# Patient Record
Sex: Female | Born: 1995
Health system: Southern US, Community
[De-identification: ages and names within clinical notes are randomized; demographics above are authoritative.]

## PROBLEM LIST (undated history)

## (undated) DIAGNOSIS — E039 Hypothyroidism, unspecified: Secondary | ICD-10-CM

## (undated) HISTORY — PX: OTHER SURGICAL HISTORY: SHX169

---
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
## 2014-12-14 HISTORY — PX: BREAST REDUCTION SURGERY: SHX8

## 2016-03-12 DIAGNOSIS — R1030 Lower abdominal pain, unspecified: Secondary | ICD-10-CM | POA: Diagnosis not present

## 2016-03-12 DIAGNOSIS — R000 Tachycardia, unspecified: Secondary | ICD-10-CM | POA: Diagnosis not present

## 2016-04-16 DIAGNOSIS — M542 Cervicalgia: Secondary | ICD-10-CM | POA: Diagnosis not present

## 2016-04-16 DIAGNOSIS — N62 Hypertrophy of breast: Secondary | ICD-10-CM | POA: Diagnosis not present

## 2016-04-16 DIAGNOSIS — M25512 Pain in left shoulder: Secondary | ICD-10-CM | POA: Diagnosis not present

## 2016-04-16 DIAGNOSIS — M25511 Pain in right shoulder: Secondary | ICD-10-CM | POA: Diagnosis not present

## 2016-04-16 DIAGNOSIS — M549 Dorsalgia, unspecified: Secondary | ICD-10-CM | POA: Diagnosis not present

## 2016-10-27 DIAGNOSIS — E669 Obesity, unspecified: Secondary | ICD-10-CM | POA: Diagnosis not present

## 2016-10-27 DIAGNOSIS — Z6837 Body mass index (BMI) 37.0-37.9, adult: Secondary | ICD-10-CM | POA: Diagnosis not present

## 2016-10-27 DIAGNOSIS — R635 Abnormal weight gain: Secondary | ICD-10-CM | POA: Diagnosis not present

## 2016-10-27 DIAGNOSIS — R300 Dysuria: Secondary | ICD-10-CM | POA: Diagnosis not present

## 2016-10-27 DIAGNOSIS — N926 Irregular menstruation, unspecified: Secondary | ICD-10-CM | POA: Diagnosis not present

## 2016-12-19 ENCOUNTER — Telehealth: Payer: No Typology Code available for payment source | Admitting: Nurse Practitioner

## 2016-12-19 DIAGNOSIS — J0101 Acute recurrent maxillary sinusitis: Secondary | ICD-10-CM

## 2016-12-19 MED ORDER — AZITHROMYCIN 250 MG PO TABS: ORAL_TABLET | ORAL | 0 refills | Status: DC

## 2016-12-19 NOTE — Progress Notes (Signed)

## 2017-01-22 DIAGNOSIS — Z9889 Other specified postprocedural states: Secondary | ICD-10-CM | POA: Diagnosis not present

## 2017-05-07 DIAGNOSIS — J301 Allergic rhinitis due to pollen: Secondary | ICD-10-CM | POA: Diagnosis not present

## 2017-05-07 DIAGNOSIS — J0101 Acute recurrent maxillary sinusitis: Secondary | ICD-10-CM | POA: Diagnosis not present

## 2017-07-16 ENCOUNTER — Emergency Department (HOSPITAL_COMMUNITY)
Admission: EM | Admit: 2017-07-16 | Discharge: 2017-07-16 | Disposition: A | Discharge status: Home / Self Care | Payer: PRIVATE HEALTH INSURANCE | Attending: Emergency Medicine | Admitting: Emergency Medicine

## 2017-07-16 ENCOUNTER — Encounter (HOSPITAL_COMMUNITY): Payer: Self-pay | Admitting: Obstetrics and Gynecology

## 2017-07-16 ENCOUNTER — Emergency Department (HOSPITAL_COMMUNITY): Payer: PRIVATE HEALTH INSURANCE

## 2017-07-16 DIAGNOSIS — S83002A Unspecified subluxation of left patella, initial encounter: Secondary | ICD-10-CM

## 2017-07-16 DIAGNOSIS — Y929 Unspecified place or not applicable: Secondary | ICD-10-CM | POA: Diagnosis not present

## 2017-07-16 DIAGNOSIS — S8992XA Unspecified injury of left lower leg, initial encounter: Secondary | ICD-10-CM | POA: Diagnosis present

## 2017-07-16 DIAGNOSIS — Y9301 Activity, walking, marching and hiking: Secondary | ICD-10-CM | POA: Diagnosis not present

## 2017-07-16 DIAGNOSIS — Y999 Unspecified external cause status: Secondary | ICD-10-CM | POA: Diagnosis not present

## 2017-07-16 DIAGNOSIS — X509XXA Other and unspecified overexertion or strenuous movements or postures, initial encounter: Secondary | ICD-10-CM | POA: Diagnosis not present

## 2017-07-16 MED ORDER — IBUPROFEN 200 MG PO TABS
600.0000 mg | ORAL_TABLET | Freq: Once | ORAL | Status: AC
  Administered 2017-07-16: 600 mg via ORAL
  Filled 2017-07-16: qty 3

## 2017-07-16 MED ORDER — ACETAMINOPHEN 500 MG PO TABS
1000.0000 mg | ORAL_TABLET | Freq: Once | ORAL | Status: AC
  Administered 2017-07-16: 1000 mg via ORAL
  Filled 2017-07-16: qty 2

## 2017-07-16 MED ORDER — OXYCODONE-ACETAMINOPHEN 5-325 MG PO TABS: 2.0000 | ORAL_TABLET | Freq: Four times a day (QID) | ORAL | 0 refills | Status: AC | PRN

## 2017-07-16 NOTE — ED Notes (Signed)
Patient transported to X-ray 

## 2017-07-16 NOTE — ED Provider Notes (Signed)
[facility] Provider Note   CSN: [id number] Arrival date & time: 07/16/17  1214     History   Chief Complaint Chief Complaint  Patient presents with  . Knee Injury    HPI Jaclyn Reynolds is a 21 y.o. female with h/o R patella fracture s/p ORIF presents to ED for evaluation of acute onset left knee pain that started today. Pt was at work walking when she felt her left knee pop out of place laterally, which caused her to fall landing directly on her left knee. She has been able to ambulate with some pain, "feels tight". Associated symptoms include swelling. Aggravating factors include palpation, flexion and weight baring. Alleviating factors include icing and rest.    No head trauma or LOC. No numbness or weakness distally.  HPI  No past medical history on file.  Patient Active Problem List   Diagnosis Date Noted  . Painful orthopaedic hardware (HCC) 04/21/2013  . Right patella fracture 09/02/2012    Past Surgical History:  Procedure Laterality Date  . FRACTURE SURGERY Right 09-02-12   current  . HARDWARE REMOVAL Right 04/21/2013   Procedure: HARDWARE REMOVAL OF RIGHT KNEE;  Surgeon: Loanne DrillingFrank V Aluisio, MD;  Location: [facility];  Service: Orthopedics;  Laterality: Right;  . no previous surgery     had caps put on teeth age 89 in dentist office -probable sedation?  . ORIF PATELLA  09/02/2012   Procedure: OPEN REDUCTION INTERNAL (ORIF) FIXATION PATELLA;  Surgeon: Loanne DrillingFrank V Aluisio, MD;  Location: [facility];  Service: Orthopedics;  Laterality: Right;    OB History    No data available       Home Medications    Prior to Admission medications   Medication Sig Start Date End Date Taking? Authorizing Provider  azithromycin (ZITHROMAX Z-PAK) 250 MG tablet As directed 12/19/16   Bennie PieriniMartin, Mary-Margaret, FNP  HYDROcodone-acetaminophen (NORCO) 5-325 MG per tablet Take 1 tablet by mouth every 6 (six) hours as needed for pain. 04/21/13   Ollen GrossAluisio, Frank, MD  oxyCODONE-acetaminophen  (PERCOCET/ROXICET) 5-325 MG tablet Take 2 tablets by mouth every 6 (six) hours as needed for severe pain. 07/16/17 07/19/17  Liberty HandyGibbons, Quinnie Barcelo J, PA-C    Family History Family History  Problem Relation Age of Onset  . Hypertension Mother   . Hypertension Maternal Grandmother   . Hyperlipidemia Maternal Grandmother   . Hypertension Maternal Grandfather   . Hyperlipidemia Maternal Grandfather     Social History Social History  Substance Use Topics  . Smoking status: Never Smoker  . Smokeless tobacco: Never Used  . Alcohol use No     Allergies   Patient has no known allergies.   Review of Systems Review of Systems  Musculoskeletal: Positive for arthralgias, gait problem and joint swelling.  Skin: Positive for wound.     Physical Exam Updated Vital Signs BP 139/85   Pulse 70   Temp 98.2 F (36.8 C) (Oral)   Resp 16   Ht 5\' 3"  (1.6 m)   Wt 99.8 kg (220 lb)   LMP 06/13/2017 (Approximate)   SpO2 97%   BMI 38.97 kg/m   Physical Exam  Constitutional: She is oriented to person, place, and time. She appears well-developed and well-nourished. No distress.  NAD.  HENT:  Head: Normocephalic and atraumatic.  Right Ear: External ear normal.  Left Ear: External ear normal.  Nose: Nose normal.  Eyes: Conjunctivae and EOM are normal. No scleral icterus.  Neck: Normal range of motion. Neck supple.  Cardiovascular: Normal rate, regular rhythm and normal heart sounds.   No murmur heard. Pulses:      Popliteal pulses are 2+ on the right side, and 2+ on the left side.       Dorsalis pedis pulses are 2+ on the right side, and 2+ on the left side.  Pulmonary/Chest: Effort normal and breath sounds normal. She has no wheezes.  Musculoskeletal: Normal range of motion. She exhibits edema and tenderness. She exhibits no deformity.  Mild infra patellar edema with tiny abrasion Mild lateral joint line and LCL tenderness.   Full passive ROM of knees bilaterally with normal patellar J  tracking bilaterally.  No bony tenderness over patella, fibular head or tibial tuberosity.   No tenderness over MCL, patellar tendon or quadriceps tendon.    Negative Lachman's. Negative posterior drawer test.  Negative McMurray's. Negative ballottement test. No varus or valgus laxity.  No crepitus with knee ROM.  Patient able to bear weight in ED (4+ steps)  Neurological: She is alert and oriented to person, place, and time.  Sensation to light touch intact in lower extremities 5/5 strength with left knee and ankle flexion and extension  Skin: Skin is warm and dry. Capillary refill takes less than 2 seconds.  Psychiatric: She has a normal mood and affect. Her behavior is normal. Judgment and thought content normal.  Nursing note and vitals reviewed.    ED Treatments / Results  Labs (all labs ordered are listed, but only abnormal results are displayed) Labs Reviewed - No data to display  EKG  EKG Interpretation None       Radiology Dg Knee Complete 4 Views Left  Result Date: 07/16/2017 CLINICAL DATA:  21 y/o F; fall with possible subluxation of the patella. EXAM: LEFT KNEE - COMPLETE 4+ VIEW COMPARISON:  None. FINDINGS: No evidence of fracture, dislocation, or joint effusion. On the frontal view the patella is mild displaced laterally from the midline. IMPRESSION: 1. On the frontal view the patella is subluxed laterally from the midline. 2. No fracture, dislocation, or joint effusion identified. Electronically Signed   By: Mitzi Hansen M.D.   On: 07/16/2017 13:50    Procedures Procedures (including critical care time)  Medications Ordered in ED Medications  ibuprofen (ADVIL,MOTRIN) tablet 600 mg (600 mg Oral Given 07/16/17 1302)  acetaminophen (TYLENOL) tablet 1,000 mg (1,000 mg Oral Given 07/16/17 1302)     Initial Impression / Assessment and Plan / ED Course  I have reviewed the triage vital signs and the nursing notes.  Pertinent labs & imaging results that  were available during my care of the patient were reviewed by me and considered in my medical decision making (see chart for details).  Clinical Course as of Jul 16 1509  Fri Jul 16, 2017  1407 FINDINGS: No evidence of fracture, dislocation, or joint effusion. On the frontal view the patella is mild displaced laterally from the midline.  IMPRESSION: 1. On the frontal view the patella is subluxed laterally from the midline. 2. No fracture, dislocation, or joint effusion identified DG Knee Complete 4 Views Left [CG]    Clinical Course User Index [CG] Liberty Handy, PA-C   21 year old female with history of right patellar fracture s/p ORIF by Dr. Despina Hick presents to the ED for sudden onset left knee pain. Based on clinical exam, patella is in correct position. She has full passive range of motion of the left knee with minimal pain and adequate J tracking, mild  lateral joint  line tenderness and edema. Some laxity noted. She is ambulatory. X-ray shows subluxation most likely, no dislocation. Extremities neurovascularly intact distally. Will place in a knee immobilizer, patient has her own crutches at home. Will instruct to follow up with orthopedist within 1 week for reevaluation, likely PT, clearance to return to work. ED return precautions given. Patient verbalized understanding and is agreeable. Work note given.  Patient, ED treatment and discharge plan was discussed with supervising physician who also evaluated the patient and is agreeable with plan.   Final Clinical Impressions(s) / ED Diagnoses   Final diagnoses:  Subluxation of left patella, initial encounter    New Prescriptions New Prescriptions   OXYCODONE-ACETAMINOPHEN (PERCOCET/ROXICET) 5-325 MG TABLET    Take 2 tablets by mouth every 6 (six) hours as needed for severe pain.     Liberty HandyGibbons, Luvern Mcisaac J, PA-C 07/16/17 1510    Charlynne PanderYao, David Hsienta, MD 07/16/17 [phone]

## 2017-07-16 NOTE — ED Notes (Signed)
Bed: WTR7 Expected date:  Expected time:  Means of arrival:  Comments: Knee injury(employee from [employer])

## 2017-07-16 NOTE — ED Notes (Signed)
Ortho Tech called. 

## 2017-07-16 NOTE — ED Triage Notes (Signed)
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 Pt felt her knee turn and felt it go out of place. C/o knee pain. Pain level 4/10

## 2017-07-16 NOTE — Discharge Instructions (Signed)
You were evaluated in the ED for left knee pain. Exam and x-ray are consistent with subluxation but no dislocation. Please wear your knee immobilizer until reevaluated and cleared by orthopedist. I recommend she schedule an appointment within one week so they can clear you to return to work. For pain take 400 mg of ibuprofen +650 mg of Tylenol every 8 hours. You have a prescription for Percocet that he can take every 6 hours for breakthrough or severe pain only. Ice, elevate and rest to decrease inflammation.  Percocet is a narcotic pain medication that has risk of overdose, death, dependence and abuse. Do not consume alcohol, drive or use heavy machinery while taking this medication. Do not leave unattended around children. The emergency department has a strict policy regarding prescription of narcotic medications. We are unable to refill this medication in the emergency department for chronic pain. Contact your primary care provider or specialist for chronic pain management and refill on narcotic medications.

## 2017-08-12 DIAGNOSIS — J301 Allergic rhinitis due to pollen: Secondary | ICD-10-CM | POA: Diagnosis not present

## 2017-09-28 DIAGNOSIS — H16012 Central corneal ulcer, left eye: Secondary | ICD-10-CM | POA: Diagnosis not present

## 2017-09-30 DIAGNOSIS — H16002 Unspecified corneal ulcer, left eye: Secondary | ICD-10-CM | POA: Diagnosis not present

## 2019-02-07 MED FILL — OSELTAMIVIR PHOSPHATE 75 MG: 75 | 10 days supply | Qty: 10 | Fill #0

## 2019-09-21 ENCOUNTER — Other Ambulatory Visit: Payer: Self-pay

## 2019-09-21 ENCOUNTER — Encounter: Payer: Self-pay | Admitting: Allergy & Immunology

## 2019-09-21 ENCOUNTER — Ambulatory Visit (INDEPENDENT_AMBULATORY_CARE_PROVIDER_SITE_OTHER): Payer: BC Managed Care – PPO | Admitting: Allergy & Immunology

## 2019-09-21 VITALS — BP 118/78 | HR 97 | Temp 98.7°F | Resp 18 | Ht 63.0 in | Wt 231.0 lb

## 2019-09-21 DIAGNOSIS — J3089 Other allergic rhinitis: Secondary | ICD-10-CM

## 2019-09-21 MED ORDER — MONTELUKAST SODIUM 10 MG PO TABS: 10.0000 mg | ORAL_TABLET | Freq: Every day | ORAL | 5 refills | Status: DC

## 2019-09-21 MED ORDER — FLUTICASONE PROPIONATE 50 MCG/ACT NA SUSP: 1.0000 | Freq: Every day | NASAL | 5 refills | Status: DC

## 2019-09-21 NOTE — Progress Notes (Signed)
NEW PATIENT  Date of Service/Encounter:  09/21/19  Referring provider: Leeroy Cha, MD   Assessment:   Perennial allergic rhinitis (dust mites)   Jaclyn Reynolds has an otherwise unremarkable past medical history, including a lack of atopic disease, but presents today for an evaluation of chronic rhinitis.  Symptoms acutely started around 2 years ago. They last year round and they have been very responsive to montelukast and cetirizine.  We did provide dust mite avoidance measures today.  I think with a combination of medications and avoidance measures, her symptoms will improve.  We did discuss appropriate use of the nasal steroid, including the technique.  Could consider allergen immunotherapy in the future if needed, but we will touch base in 2 months to see how she is doing.  If we do go ahead with allergen immunotherapy, we might need to do the more sensitive intradermal testing for her as well.   Plan/Recommendations:   1. Perennial allergic rhinitis - Testing today showed: dust mites - Copy of test results provided.  - Avoidance measures provided. - Continue with: Allegra (fexofenadine) 180mg  table once daily, Singulair (montelukast) 10mg  daily and Flonase (fluticasone) one spray per nostril daily - You can use an extra dose of the antihistamine, if needed, for breakthrough symptoms.  - Consider nasal saline rinses 1-2 times daily to remove allergens from the nasal cavities as well as help with mucous clearance (this is especially helpful to do before the nasal sprays are given)  2. Return in about 2 months (around 11/21/2019). This can be an in-person, a virtual Webex or a telephone follow up visit.   Subjective:   Jaclyn Reynolds is a 23 y.o. female presenting today for evaluation of  Chief Complaint  Patient presents with  . Allergic Rhinitis     Jaclyn Reynolds has a history of the following: Patient Active Problem List   Diagnosis Date Noted  . Painful orthopaedic  hardware ([facility]) 04/21/2013  . Right patella fracture 09/02/2012    History obtained from: chart review and patient.  Jaclyn Reynolds was referred by Leeroy Cha, MD.     Jaclyn Reynolds is a 23 y.o. female presenting for an evaluation of allergic rhinitis. 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    Allergic Rhinitis Symptom History: She has had symptoms for two years since she got married. When this first started, she was living in some old apartments. They did get a dog for around one year. But her symptoms started before this. She did not have any of these problems before she got married. Symptoms do not improve during the winter at all. iut tends to get worse when the seasons change. Her symptoms are all associated with sneezing and rhinorrhea. She was having problems waking up in the middle of the night with congestion. She has tried Xyzal, Human resources officer, Zyrtec, and Singulair. Singulair was just started around one week. With the Singulair and Allegra, she could breathe again. She used fluticasone but she was having nasal dryness. Her symptoms do get worse inside of the hospital versus outside of the hospital. Symptoms do seem better outside versus inside but they are always present.   She gets sinus infections around one or two times per year. Otherwise she does not get any antibiotics or infections at all.   Otherwise, there is no history of other atopic diseases, including asthma, food allergies, drug allergies, stinging insect allergies, eczema,  urticaria or contact dermatitis. There is no significant infectious history. Vaccinations are up to date.    Past Medical History: Patient Active Problem List   Diagnosis Date Noted  . Painful orthopaedic hardware ([facility]) 04/21/2013  . Right patella fracture 09/02/2012    Medication List:  Allergies as of 09/21/2019      Reactions   Doxycycline Rash      Medication List        Accurate as of September 21, 2019 12:00 PM. If you have any questions, ask your nurse or doctor.        STOP taking these medications   azithromycin 250 MG tablet Commonly known as: Zithromax Z-Pak Stopped by: Alfonse Spruce, MD   HYDROcodone-acetaminophen 5-325 MG tablet Commonly known as: Norco Stopped by: Alfonse Spruce, MD     TAKE these medications   Blisovi 24 Fe 1-20 MG-MCG(24) tablet Generic drug: Norethindrone Acetate-Ethinyl Estrad-FE   cetirizine 10 MG tablet Commonly known as: ZYRTEC Take 10 mg by mouth daily.   fexofenadine 180 MG tablet Commonly known as: ALLEGRA Take 180 mg by mouth daily.   fluticasone 50 MCG/ACT nasal spray Commonly known as: FLONASE Place 1 spray into both nostrils daily. Started by: Alfonse Spruce, MD   mometasone 50 MCG/ACT nasal spray Commonly known as: NASONEX PLACE 2 SPRAYS IN EACH NOSTRIL ONCE A DAY   montelukast 10 MG tablet Commonly known as: SINGULAIR Take 1 tablet (10 mg total) by mouth daily.   Xyzal 5 MG tablet Generic drug: levocetirizine Take 5 mg by mouth every evening.       Birth History: non-contributory  Developmental History: non-contributory  Past Surgical History: Past Surgical History:  Procedure Laterality Date  . BREAST REDUCTION SURGERY  2016  . FRACTURE SURGERY Right 09-02-12   current  . HARDWARE REMOVAL Right 04/21/2013   Procedure: HARDWARE REMOVAL OF RIGHT KNEE;  Surgeon: Loanne Drilling, MD;  Location: [facility];  Service: Orthopedics;  Laterality: Right;  . no previous surgery     had caps put on teeth age 86 in dentist office -probable sedation?  . ORIF PATELLA  09/02/2012   Procedure: OPEN REDUCTION INTERNAL (ORIF) FIXATION PATELLA;  Surgeon: Loanne Drilling, MD;  Location: [facility];  Service: Orthopedics;  Laterality: Right;     Family History: Family History  Problem Relation Age of Onset  . Hypertension Mother   . Hypertension Maternal Grandmother   . Hyperlipidemia  Maternal Grandmother   . Hypertension Maternal Grandfather   . Hyperlipidemia Maternal Grandfather      Social History: Jaclyn Reynolds lives at home with her husband of two years. They do have a dog for the past year.  They live in an apartment that is 24 years old.  There is carpeting throughout the apartment.  They have electric heating and central cooling.  There are no dust mite covers on the bedding.  There is no tobacco exposure.  Her husband works for [employer] with billing.  He previously worked as a Personnel officer.   Review of Systems  Constitutional: Negative.  Negative for fever, malaise/fatigue and weight loss.  HENT: Positive for congestion. Negative for ear discharge, ear pain and sinus pain.        Positive for postnasal drip.  Eyes: Negative for pain, discharge and redness.  Respiratory: Negative for cough, sputum production, shortness of breath and wheezing.   Cardiovascular: Negative.  Negative for chest pain and palpitations.  Gastrointestinal: Negative for abdominal pain, constipation, diarrhea,  heartburn, nausea and vomiting.  Skin: Negative.  Negative for itching and rash.  Neurological: Negative for dizziness and headaches.  Endo/Heme/Allergies: Positive for environmental allergies. Does not bruise/bleed easily.       Objective:   Blood pressure 118/78, pulse 97, temperature 98.7 F (37.1 C), temperature source Temporal, resp. rate 18, height 5\' 3"  (1.6 m), weight 231 lb (104.8 kg), SpO2 97 %. Body mass index is 40.92 kg/m.   Physical Exam:   Physical Exam  Constitutional: She appears well-developed.  Obese female.  Very friendly and interactive.  HENT:  Head: Normocephalic and atraumatic.  Right Ear: Tympanic membrane, external ear and ear canal normal. No drainage, swelling or tenderness. Tympanic membrane is not injected, not scarred, not erythematous, not retracted and not bulging.  Left Ear: Tympanic membrane, external ear and ear canal normal. No  drainage, swelling or tenderness. Tympanic membrane is not injected, not scarred, not erythematous, not retracted and not bulging.  Nose: Mucosal edema and rhinorrhea present. No nasal deformity or septal deviation. No epistaxis. Right sinus exhibits no maxillary sinus tenderness and no frontal sinus tenderness. Left sinus exhibits no maxillary sinus tenderness and no frontal sinus tenderness.  Mouth/Throat: Uvula is midline and oropharynx is clear and moist. Mucous membranes are not pale and not dry.  Tonsils 2+ without exudates.  Eyes: Pupils are equal, round, and reactive to light. Conjunctivae and EOM are normal. Right eye exhibits no chemosis and no discharge. Left eye exhibits no chemosis and no discharge. Right conjunctiva is not injected. Left conjunctiva is not injected.  Cardiovascular: Normal rate, regular rhythm and normal heart sounds.  Respiratory: Effort normal and breath sounds normal. No accessory muscle usage. No tachypnea. No respiratory distress. She has no wheezes. She has no rhonchi. She has no rales. She exhibits no tenderness.  Moving air well in all lung fields.  GI: There is no abdominal tenderness. There is no rebound and no guarding.  Lymphadenopathy:       Head (right side): No submandibular, no tonsillar and no occipital adenopathy present.       Head (left side): No submandibular, no tonsillar and no occipital adenopathy present.    She has no cervical adenopathy.  Neurological: She is alert.  Skin: No abrasion, no petechiae and no rash noted. Rash is not papular, not vesicular and not urticarial. No erythema. No pallor.  No eczematous or urticarial lesions noted.  Psychiatric: She has a normal mood and affect.     Diagnostic studies:    Spirometry:   Allergy Studies:    Airborne Adult Perc - 09/21/19 1009    Time Antigen Placed  1015    Allergen Manufacturer  11/21/19    Location  Back    Number of Test  59    1. Control-Buffer 50% Glycerol  Negative    2.  Control-Histamine 1 mg/ml  Negative    3. Albumin saline  Negative    4. Bahia  Negative    5. Waynette Buttery  Negative    6. Johnson  Negative    7. Kentucky Blue  Negative    8. Meadow Fescue  Negative    9. Perennial Rye  Negative    10. Sweet Vernal  Negative    11. Timothy  Negative    12. Cocklebur  Negative    13. Burweed Marshelder  Negative    14. Ragweed, short  Negative    15. Ragweed, Giant  Negative    16. Plantain,  English  Negative    17. Lamb's Quarters  Negative    18. Sheep Sorrell  Negative    19. Rough Pigweed  Negative    20. Marsh Elder, Rough  Negative    21. Mugwort, Common  Negative    22. Ash mix  Negative    23. Birch mix  Negative    24. Beech American  Negative    25. Box, Elder  Negative    26. Cedar, red  Negative    27. Cottonwood, Guinea-BissauEastern  Negative    28. Elm mix  Negative    29. Hickory mix  Negative    30. Maple mix  Negative    31. Oak, Guinea-BissauEastern mix  Negative    32. Pecan Pollen  Negative    33. Pine mix  Negative    34. Sycamore Eastern  Negative    35. Walnut, Black Pollen  Negative    36. Alternaria alternata  Negative    37. Cladosporium Herbarum  Negative    38. Aspergillus mix  Negative    39. Penicillium mix  Negative    40. Bipolaris sorokiniana (Helminthosporium)  Negative    41. Drechslera spicifera (Curvularia)  Negative    42. Mucor plumbeus  Negative    43. Fusarium moniliforme  Negative    44. Aureobasidium pullulans (pullulara)  Negative    45. Rhizopus oryzae  Negative    46. Botrytis cinera  Negative    47. Epicoccum nigrum  Negative    48. Phoma betae  Negative    49. Candida Albicans  Negative    50. Trichophyton mentagrophytes  Negative    51. Mite, D Farinae  5,000 AU/ml  3+    52. Mite, D Pteronyssinus  5,000 AU/ml  4+    53. Cat Hair 10,000 BAU/ml  Negative    54.  Dog Epithelia  Negative    55. Mixed Feathers  Negative    56. Horse Epithelia  Negative    57. Cockroach, German  Negative    58. Mouse  Negative     59. Tobacco Leaf  Negative       Allergy testing results were read and interpreted by myself, documented by clinical staff.         Malachi BondsJoel Illiana Losurdo, MD [facility]

## 2019-09-21 NOTE — Patient Instructions (Addendum)
1. Perennial allergic rhinitis - Testing today showed: dust mites - Copy of test results provided.  - Avoidance measures provided. - Continue with: Allegra (fexofenadine) 180mg  table once daily, Singulair (montelukast) 10mg  daily and Flonase (fluticasone) one spray per nostril daily - You can use an extra dose of the antihistamine, if needed, for breakthrough symptoms.  - Consider nasal saline rinses 1-2 times daily to remove allergens from the nasal cavities as well as help with mucous clearance (this is especially helpful to do before the nasal sprays are given)  2. Return in about 2 months (around 11/21/2019). This can be an in-person, a virtual Webex or a telephone follow up visit.   Please inform of any Emergency Department visits, hospitalizations, or changes in symptoms. Call 14/07/2019 before going to the ED for breathing or allergy symptoms since we might be able to fit you in for a sick visit. Feel free to contact us anytime with any questions, problems, or concerns.  It was a pleasure to meet you today!  Websites that have reliable patient information: 1. American Academy of Asthma, Allergy, and Immunology: www.aaaai.org 2. Food Allergy Research and Education (FARE): foodallergy.org 3. Mothers of Asthmatics: http://www.asthmacommunitynetwork.org 4. American College of Allergy, Asthma, and Immunology: www.acaai.org  "Like" [place] on Facebook and Instagram for our latest updates!      Make sure you are registered to vote! If you have moved or changed any of your contact information, you will need to get this updated before voting!  In some cases, you MAY be able to register to vote online: [place]    Voter ID laws are NOT going into effect for the General Election in November 2020! DO NOT let this stop you from exercising your right to vote!   Absentee voting is the SAFEST way to vote during the coronavirus pandemic!   Download and print an  absentee ballot request form at [email or web address]-[email or web address]-Request or you can scan the QR code below with your smart phone:      More information on absentee ballots can be found here: [email or web address]://[email or web address]-Absentee    Control of House Dust Mite Allergen    House dust mites play a major role in allergic asthma and rhinitis.  They occur in environments with high humidity wherever human skin, the food for dust mites is found. High levels have been detected in dust obtained from mattresses, pillows, carpets, upholstered furniture, bed covers, clothes and soft toys.  The principal allergen of the house dust mite is found in its feces.  A gram of dust may contain 1,000 mites and 250,000 fecal particles.  Mite antigen is easily measured in the air during house cleaning activities.    1. Encase mattresses, including the box spring, and pillow, in an air tight cover.  Seal the zipper end of the encased mattresses with wide adhesive tape. 2. Wash the bedding in water of 130 degrees Farenheit weekly.  Avoid cotton comforters/quilts and flannel bedding: the most ideal bed covering is the dacron comforter. 3. Remove all upholstered furniture from the bedroom. 4. Remove carpets, carpet padding, rugs, and non-washable window drapes from the bedroom.  Wash drapes weekly or use plastic window coverings. 5. Remove all non-washable stuffed toys from the bedroom.  Wash stuffed toys weekly. 6. Have the room cleaned frequently with a vacuum cleaner and a damp dust-mop.  The patient should not be in a room which is being cleaned and should wait 1 hour after cleaning before going into the room. 7. Close and  seal all heating outlets in the bedroom.  Otherwise, the room will become filled with dust-laden air.  An electric heater can be used to heat the room. 8. Reduce indoor humidity to less than 50%.  Do not use a humidifier.

## 2019-11-21 ENCOUNTER — Ambulatory Visit (INDEPENDENT_AMBULATORY_CARE_PROVIDER_SITE_OTHER): Payer: BC Managed Care – PPO | Admitting: Allergy & Immunology

## 2019-11-21 ENCOUNTER — Encounter: Payer: Self-pay | Admitting: Allergy & Immunology

## 2019-11-21 DIAGNOSIS — J3089 Other allergic rhinitis: Secondary | ICD-10-CM | POA: Diagnosis not present

## 2019-11-21 MED ORDER — MONTELUKAST SODIUM 10 MG PO TABS: 10.0000 mg | ORAL_TABLET | Freq: Every day | ORAL | 5 refills | Status: DC

## 2019-11-21 NOTE — Progress Notes (Signed)
RE: Jaclyn Reynolds MRN: [id number] DOB: 06-27-1996 Date of Telemedicine Visit: 11/21/2019  Referring provider: Lorenda Ishihara,* Primary care provider: Lorenda Ishihara, MD  Chief Complaint: Allergic Rhinitis    Telemedicine Follow Up Visit via Telephone: I connected with Jaclyn Reynolds for a follow up on 11/21/19 by telephone and verified that I am speaking with the correct person using two identifiers.   I discussed the limitations, risks, security and privacy concerns of performing an evaluation and management service by telephone and the availability of in person appointments. I also discussed with the patient that there may be a patient responsible charge related to this service. The patient expressed understanding and agreed to proceed.  Patient is at home.  Provider is at the office.  Visit start time: 11:28 AM Visit end time: 11:41 AM Insurance consent/check in by: Dr. Dellis Anes Medical consent and medical assistant/nurse: Dr. Dellis Anes  History of Present Illness:  She is a 23 y.o. female, who is being followed for perennial allergic rhinitis. Her previous allergy office visit was in October 2020 with myself. At that time, she had prick testing that was positive only to dust mites (although we did not do intradermal testing). We continued her on montelukast, fexofenadine, and fluticasone. We also recommended avoidance measures to help decrease her dust mite exposure.   She has done well since the last visit. She did get the dust mite covers one week ago. She does continue to sneeze, maybe twice per day. This is typically before she takes her medicine. She is on the montelukast and cetirizine.   Her other PCP put her on Allegra and Singulair.  However, after stopping the Allegra for 3 days for her visit and restarting it, she did not feel that it provided as much relief. Therefore she changed to cetirizine instead, with good results.   She is remaining busy at work  with COVID numbers increasing. She is a Architect.   Otherwise, there have been no changes to her past medical history, surgical history, family history, or social history.  Assessment and Plan:  Jaclyn Reynolds is a 23 y.o. female with:  Perennial allergic rhinitis (dust mites)   Jaclyn Reynolds is doing fairly well with the current regimen. We are not going to make any changes at this time, but I did tell her that she could use an extra cetirizine on particularly bad days. Side effects are fairly well tolerated, including some increased somnolence. I think we can avoid the use of allergen immunotherapy at this time. She is in agreement with the plan.   Diagnostics: None.  Medication List:  Current Outpatient Medications  Medication Sig Dispense Refill  . cetirizine (ZYRTEC) 10 MG tablet Take 10 mg by mouth daily.    . fluticasone (FLONASE) 50 MCG/ACT nasal spray Place 1 spray into both nostrils daily. 16 g 5  . montelukast (SINGULAIR) 10 MG tablet Take 1 tablet (10 mg total) by mouth daily. 30 tablet 5  . Norethindrone Acetate-Ethinyl Estrad-FE (BLISOVI 24 FE) 1-20 MG-MCG(24) tablet      No current facility-administered medications for this visit.    Allergies: Allergies  Allergen Reactions  . Doxycycline Rash   I reviewed her past medical history, social history, family history, and environmental history and no significant changes have been reported from previous visits.  Review of Systems  Constitutional: Negative for activity change, appetite change, chills, fatigue and fever.  HENT: Negative for congestion, postnasal drip, rhinorrhea, sinus pressure and sore throat.   Eyes: Negative for  pain, discharge, redness and itching.  Respiratory: Negative for shortness of breath, wheezing and stridor.   Gastrointestinal: Negative for diarrhea, nausea and vomiting.  Musculoskeletal: Negative for arthralgias, joint swelling and myalgias.  Skin: Negative for rash.  Allergic/Immunologic: Negative for  environmental allergies and food allergies.    Objective:  Physical exam not obtained as encounter was done via telephone.   Previous notes and tests were reviewed.  I discussed the assessment and treatment plan with the patient. The patient was provided an opportunity to ask questions and all were answered. The patient agreed with the plan and demonstrated an understanding of the instructions.   The patient was advised to call back or seek an in-person evaluation if the symptoms worsen or if the condition fails to improve as anticipated.  I provided 13 minutes of non-face-to-face time during this encounter.  It was my pleasure to participate in [place] care today. Please feel free to contact me with any questions or concerns.   Sincerely,  Valentina Shaggy, MD

## 2020-02-22 ENCOUNTER — Ambulatory Visit: Payer: PRIVATE HEALTH INSURANCE | Admitting: Registered"

## 2020-05-21 ENCOUNTER — Encounter: Payer: Self-pay | Admitting: Allergy & Immunology

## 2020-05-21 ENCOUNTER — Other Ambulatory Visit: Payer: Self-pay

## 2020-05-21 ENCOUNTER — Ambulatory Visit (INDEPENDENT_AMBULATORY_CARE_PROVIDER_SITE_OTHER): Payer: BC Managed Care – PPO | Admitting: Allergy & Immunology

## 2020-05-21 VITALS — BP 112/70 | HR 85 | Temp 98.4°F | Resp 16 | Ht 63.5 in | Wt 224.8 lb

## 2020-05-21 DIAGNOSIS — J3089 Other allergic rhinitis: Secondary | ICD-10-CM | POA: Diagnosis not present

## 2020-05-21 MED ORDER — FLUTICASONE PROPIONATE 50 MCG/ACT NA SUSP: 1.0000 | Freq: Every day | NASAL | 5 refills | Status: DC

## 2020-05-21 MED ORDER — CETIRIZINE HCL 10 MG PO TABS: 10.0000 mg | ORAL_TABLET | Freq: Every day | ORAL | 5 refills | Status: AC

## 2020-05-21 MED ORDER — MONTELUKAST SODIUM 10 MG PO TABS: 10.0000 mg | ORAL_TABLET | Freq: Every day | ORAL | 5 refills | Status: DC

## 2020-05-21 NOTE — Progress Notes (Signed)
FOLLOW UP  Date of Service/Encounter:  05/21/20   Assessment:   Perennial allergic rhinitis (dust mites)   At this point in time, Jaclyn Reynolds is doing fairly well.  We have not done intradermal testing, which is more sensitive than prick testing.  However, she does feel better on the medications as well as with the avoidance measures.  If she felt more of a seasonal change in her symptoms, we could certainly do the intradermal testing.  However, she is comfortable just staying with the current plan.  We also discussed initiating sublingual immunotherapy for dust mite, so we have that as an option as well.  Plan/Recommendations:   1. Perennial allergic rhinitis (dust mites) - We can consider intradermal testing in the future.  - Continue with: Zyrtec (cetirizine) 10mg  tablet once daily, Singulair (montelukast) 10mg  daily and Flonase (fluticasone) one spray per nostril daily - You can use an extra dose of the antihistamine (cetirizine), if needed, for breakthrough symptoms.  - Consider nasal saline rinses 1-2 times daily to remove allergens from the nasal cavities as well as help with mucous clearance (this is especially helpful to do before the nasal sprays are given). - Consider Odactra tablets (dust mite sublingual immunotherapy).   2. Return in about 1 year (around 05/21/2021). This can be an in-person, a virtual Webex or a telephone follow up visit.  Subjective:   Jaclyn Reynolds is a 24 y.o. female presenting today for follow up of  Chief Complaint  Patient presents with  . Follow-up  . Allergic Rhinitis     still sneezing but not as bad as before     Jaclyn Reynolds has a history of the following: Patient Active Problem List   Diagnosis Date Noted  . Painful orthopaedic hardware ([place]) 04/21/2013  . Right patella fracture 09/02/2012    History obtained from: chart review and patient.  Jaclyn Reynolds is a 24 y.o. female presenting for a follow up visit.  She was last seen in December  2020.  At that time, she was doing fairly well.  We did not make any changes at this time.  I did recommend that she uses cetirizine on particularly bad days.  Since last visit, she has done well.  Overall, her symptoms are better.  She does have some sneezing in the morning, but that tends to get better as the day goes on.  She also has worsening symptoms with weather changes.  Overall symptoms are better. She does have sneezing episodes in the morning. She does feel that her symptoms worsen with the weather changes.  Otherwise, there have been no changes to her past medical history, surgical history, family history, or social history.    Review of Systems  Constitutional: Negative.  Negative for chills, fever, malaise/fatigue and weight loss.  HENT: Positive for congestion and sinus pain. Negative for ear discharge and ear pain.        Positive for sneezing.  Eyes: Negative for pain, discharge and redness.  Respiratory: Negative for cough, sputum production, shortness of breath and wheezing.   Cardiovascular: Negative.  Negative for chest pain and palpitations.  Gastrointestinal: Negative for abdominal pain, constipation, diarrhea, heartburn, nausea and vomiting.  Skin: Negative.  Negative for itching and rash.  Neurological: Negative for dizziness and headaches.  Endo/Heme/Allergies: Positive for environmental allergies. Does not bruise/bleed easily.       Objective:   Blood pressure 112/70, pulse 85, temperature 98.4 F (36.9 C), temperature source Temporal, resp. rate 16, height  5' 3.5" (1.613 m), weight 224 lb 12.8 oz (102 kg), SpO2 97 %. Body mass index is 39.2 kg/m.   Physical Exam:  Physical Exam  Constitutional: She appears well-developed.  Pleasant female.  Very talkative.  HENT:  Head: Normocephalic and atraumatic.  Right Ear: Tympanic membrane, external ear and ear canal normal.  Left Ear: Tympanic membrane, external ear and ear canal normal.  Nose: Mucosal  edema and rhinorrhea present. No nasal deformity or septal deviation. No epistaxis. Right sinus exhibits no maxillary sinus tenderness and no frontal sinus tenderness. Left sinus exhibits no maxillary sinus tenderness and no frontal sinus tenderness.  Mouth/Throat: Uvula is midline and oropharynx is clear and moist. Mucous membranes are not pale and not dry.  Turbinates enlarged bilaterally.  Eyes: Pupils are equal, round, and reactive to light. Conjunctivae and EOM are normal. Right eye exhibits no chemosis and no discharge. Left eye exhibits no chemosis and no discharge. Right conjunctiva is not injected. Left conjunctiva is not injected.  Cardiovascular: Normal rate, regular rhythm and normal heart sounds.  Respiratory: Effort normal and breath sounds normal. No accessory muscle usage. No tachypnea. No respiratory distress. She has no wheezes. She has no rhonchi. She has no rales. She exhibits no tenderness.  Lymphadenopathy:    She has no cervical adenopathy.  Neurological: She is alert.  Skin: No abrasion, no petechiae and no rash noted. Rash is not papular, not vesicular and not urticarial. No erythema. No pallor.  Psychiatric: She has a normal mood and affect.     Diagnostic studies: none      Malachi Bonds, MD  [facility]

## 2020-05-21 NOTE — Patient Instructions (Addendum)
1. Perennial allergic rhinitis (dust mites) - We can consider intradermal testing in the future.  - Continue with: Zyrtec (cetirizine) 10mg  tablet once daily, Singulair (montelukast) 10mg  daily and Flonase (fluticasone) one spray per nostril daily - You can use an extra dose of the antihistamine (cetirizine), if needed, for breakthrough symptoms.  - Consider nasal saline rinses 1-2 times daily to remove allergens from the nasal cavities as well as help with mucous clearance (this is especially helpful to do before the nasal sprays are given). - Consider Odactra tablets (dust mite sublingual immunotherapy).   2. Return in about 1 year (around 05/21/2021). This can be an in-person, a virtual Webex or a telephone follow up visit.   Please inform of any Emergency Department visits, hospitalizations, or changes in symptoms. Call 07/21/2021 before going to the ED for breathing or allergy symptoms since we might be able to fit you in for a sick visit. Feel free to contact us anytime with any questions, problems, or concerns.  It was a pleasure to see you again today!  Websites that have reliable patient information: 1. American Academy of Asthma, Allergy, and Immunology: www.aaaai.org 2. Food Allergy Research and Education (FARE): foodallergy.org 3. Mothers of Asthmatics: http://www.asthmacommunitynetwork.org 4. American College of Allergy, Asthma, and Immunology: www.acaai.org   COVID-19 Vaccine Information can be found at: [place] For questions related to vaccine distribution or appointments, please email [email or web address] or call [phone].     "Like" [email or web address] on Facebook and Instagram for our latest updates!       HAPPY SPRING!  Make sure you are registered to vote! If you have moved or changed any of your contact information, you will need to get this updated before voting!  In some cases, you MAY be able to register to  vote online: [phone]

## 2020-07-18 ENCOUNTER — Ambulatory Visit: Payer: BC Managed Care – PPO | Admitting: Allergy & Immunology

## 2020-07-23 ENCOUNTER — Encounter: Payer: Self-pay | Admitting: Allergy & Immunology

## 2020-07-23 ENCOUNTER — Other Ambulatory Visit: Payer: Self-pay

## 2020-07-23 ENCOUNTER — Ambulatory Visit (INDEPENDENT_AMBULATORY_CARE_PROVIDER_SITE_OTHER): Payer: BC Managed Care – PPO | Admitting: Allergy & Immunology

## 2020-07-23 VITALS — BP 110/80 | HR 89 | Resp 18 | Ht 63.5 in

## 2020-07-23 DIAGNOSIS — J330 Polyp of nasal cavity: Secondary | ICD-10-CM | POA: Diagnosis not present

## 2020-07-23 DIAGNOSIS — J3089 Other allergic rhinitis: Secondary | ICD-10-CM

## 2020-07-23 NOTE — Patient Instructions (Addendum)
1. Perennial allergic rhinitis - Continue with cetirizine 10mg  1-2 times daily. - Continue with Singulair (montelukast) 10mg  daily.  - Stop the Flonase and start Xhance 1-2 sprays per nostril twice daily (sample provided). - We will send in the script to the [facility] outpatient pharmacy, and they will call you to confirm your shipping address. - You can review how to use the device here: https://www.xhance.com - Ask to be enrolled in the auto-refill program so you can get a year for free. - We are going to get some blood work to look for more allergies. - We could do intradermal testing at the next visit.   2. Return in about 4 weeks (around 08/20/2020) for intradermal allergy testing (stop your cetirizine for three days before).    Please inform [facility] of any Emergency Department visits, hospitalizations, or changes in symptoms. Call 10/20/2020 before going to the ED for breathing or allergy symptoms since we might be able to fit you in for a sick visit. Feel free to contact us anytime with any questions, problems, or concerns.  It was a pleasure to see you again today!  Websites that have reliable patient information: 1. American Academy of Asthma, Allergy, and Immunology: www.aaaai.org 2. Food Allergy Research and Education (FARE): foodallergy.org 3. Mothers of Asthmatics: http://www.asthmacommunitynetwork.org 4. American College of Allergy, Asthma, and Immunology: www.acaai.org   COVID-19 Vaccine Information can be found at: [place] For questions related to vaccine distribution or appointments, please email vaccine@ [email or web address] or call [phone].     "Like" [email or web address] on Facebook and Instagram for our latest updates!       HAPPY SPRING!  Make sure you are registered to vote! If you have moved or changed any of your contact information, you will need to get this updated before voting!  In some cases, you MAY be able to  register to vote online: [phone]

## 2020-07-23 NOTE — Progress Notes (Signed)
FOLLOW UP  Date of Service/Encounter:  07/24/20   Assessment:   Perennial allergic rhinitis(dust mites)  Right nasal polyp  Plan/Recommendations:   1. Perennial allergic rhinitis with right sided nasal polyp - Continue with cetirizine 10mg  1-2 times daily. - Continue with Singulair (montelukast) 10mg  daily.  - Stop the Flonase and start Xhance 1-2 sprays per nostril twice daily (sample provided). - We will send in the script to the [facility] outpatient pharmacy, and they will call you to confirm your shipping address. - You can review how to use the device here: https://www.xhance.com - Ask to be enrolled in the auto-refill program so you can get a year for free. - We are going to get some blood work to look for more allergies. - We could do intradermal testing at the next visit.   2. Return in about 4 weeks (around 08/20/2020) for intradermal allergy testing (stop your cetirizine for three days before).    Subjective:   Jaclyn Reynolds is a 24 y.o. female presenting today for follow up of  Chief Complaint  Patient presents with  . Allergic Rhinitis   . Urticaria    [name] 24 has a history of the following: Patient Active Problem List   Diagnosis Date Noted  . Polyp of nasal cavity 07/24/2020  . Perennial allergic rhinitis 07/24/2020  . Painful orthopaedic hardware ([facility]) 04/21/2013  . Right patella fracture 09/02/2012    History obtained from: chart review and patient.  Jaclyn Reynolds is a 24 y.o. female presenting for a follow up visit.  She has a perennial allergic rhinitis history and was last seen in June 2021.  At that time, she seemed to be doing fairly well so we did not pursue intradermal testing.  She has only ever undergone prick testing.  We continue with Zyrtec as well as Singulair and Flonase.  We did talk about allergen immunotherapy, including Odactra.  She was not interested in it at the time.  Since last visit, she has had worsening congestion which always  gets worse at night.  She also reports sneezing and sinus pressure for several days with clear rhinorrhea.  She has had no fever with this.  She ended up using an over-the-counter decongestion to try to get through this as well as humidifier in her room.  This helped quite a bit.  Of note, she is fully vaccinated to COVID-19, as she works as an 25.  She has had no known exposures to COVID-19.  She really is interested in pursuing additional testing at this point and is open to both blood and intradermal testing.  Otherwise, there have been no changes to her past medical history, surgical history, family history, or social history.    Review of Systems  Constitutional: Negative.  Negative for chills, fever, malaise/fatigue and weight loss.  HENT: Positive for congestion and sinus pain. Negative for ear discharge and ear pain.   Eyes: Negative for pain, discharge and redness.  Respiratory: Negative for cough, sputum production, shortness of breath and wheezing.   Cardiovascular: Negative.  Negative for chest pain and palpitations.  Gastrointestinal: Negative for abdominal pain, constipation, diarrhea, heartburn, nausea and vomiting.  Skin: Negative.  Negative for itching and rash.  Neurological: Negative for dizziness and headaches.  Endo/Heme/Allergies: Positive for environmental allergies. Does not bruise/bleed easily.       Objective:   Blood pressure 110/80, pulse 89, resp. rate 18, height 5' 3.5" (1.613 m), SpO2 97 %. Body mass index is 39.2 kg/m.  Physical Exam:  Physical Exam Constitutional:      Appearance: She is well-developed.  HENT:     Head: Normocephalic and atraumatic.     Right Ear: Tympanic membrane, ear canal and external ear normal.     Left Ear: Tympanic membrane, ear canal and external ear normal.     Nose: No nasal deformity, septal deviation, mucosal edema or rhinorrhea.     Right Turbinates: Enlarged, swollen and pale.     Left Turbinates: Enlarged,  swollen and pale.     Right Sinus: No maxillary sinus tenderness or frontal sinus tenderness.     Left Sinus: No maxillary sinus tenderness or frontal sinus tenderness.     Comments: Right nasal polyp visualized within the nasal cavity obstructing approximately 50 to 75% of the nasal passage.  There is a marked amount of clear rhinorrhea.    Mouth/Throat:     Mouth: Mucous membranes are not pale and not dry.     Pharynx: Uvula midline.  Eyes:     General:        Right eye: No discharge.        Left eye: No discharge.     Conjunctiva/sclera: Conjunctivae normal.     Right eye: Right conjunctiva is not injected. No chemosis.    Left eye: Left conjunctiva is not injected. No chemosis.    Pupils: Pupils are equal, round, and reactive to light.  Cardiovascular:     Rate and Rhythm: Normal rate and regular rhythm.     Heart sounds: Normal heart sounds.  Pulmonary:     Effort: Pulmonary effort is normal. No tachypnea, accessory muscle usage or respiratory distress.     Breath sounds: Normal breath sounds. No wheezing, rhonchi or rales.     Comments: Moving air well in all lung fields.  No increased work of breathing. Chest:     Chest wall: No tenderness.  Lymphadenopathy:     Cervical: No cervical adenopathy.  Skin:    Coloration: Skin is not pale.     Findings: No abrasion, erythema, petechiae or rash. Rash is not papular, urticarial or vesicular.     Comments: No eczematous or urticarial lesions noted.  Neurological:     Mental Status: She is alert.      Diagnostic studies: labs sent instead     Malachi Bonds, MD  [facility]

## 2020-07-24 ENCOUNTER — Encounter: Payer: Self-pay | Admitting: Allergy & Immunology

## 2020-07-24 DIAGNOSIS — J330 Polyp of nasal cavity: Secondary | ICD-10-CM | POA: Insufficient documentation

## 2020-07-24 DIAGNOSIS — J3089 Other allergic rhinitis: Secondary | ICD-10-CM | POA: Insufficient documentation

## 2020-07-24 MED ORDER — XHANCE 93 MCG/ACT NA EXHU: 2.0000 | INHALANT_SUSPENSION | Freq: Two times a day (BID) | NASAL | 5 refills | Status: AC

## 2020-07-25 LAB — IGE+ALLERGENS ZONE 2(30)
Alternaria Alternata IgE: <0.1 kU/L
Amer Sycamore IgE Qn: <0.1 kU/L
Aspergillus Fumigatus IgE: <0.1 kU/L
Bahia Grass IgE: <0.1 kU/L
Bermuda Grass IgE: <0.1 kU/L
Cat Dander IgE: <0.1 kU/L
Cedar, Mountain IgE: <0.1 kU/L
Cladosporium Herbarum IgE: <0.1 kU/L
Cockroach, American IgE: <0.1 kU/L
Common Silver Birch IgE: <0.1 kU/L
D Farinae IgE: 2.35 kU/L — AB
D Pteronyssinus IgE: 2.61 kU/L — AB
Dog Dander IgE: 2.16 kU/L — AB
Elm, American IgE: <0.1 kU/L
Hickory, White IgE: <0.1 kU/L
IgE (Immunoglobulin E), Serum: 37 IU/mL (ref 6–495)
Johnson Grass IgE: <0.1 kU/L
Maple/Box Elder IgE: <0.1 kU/L
Mucor Racemosus IgE: <0.1 kU/L
Mugwort IgE Qn: <0.1 kU/L
Nettle IgE: <0.1 kU/L
Oak, White IgE: <0.1 kU/L
Penicillium Chrysogen IgE: <0.1 kU/L
Pigweed, Rough IgE: <0.1 kU/L
Plantain, English IgE: <0.1 kU/L
Ragweed, Short IgE: <0.1 kU/L
Sheep Sorrel IgE Qn: <0.1 kU/L
Stemphylium Herbarum IgE: <0.1 kU/L
Sweet gum IgE RAST Ql: <0.1 kU/L
Timothy Grass IgE: <0.1 kU/L
White Mulberry IgE: <0.1 kU/L

## 2020-07-25 LAB — ALLERGEN PROFILE, MOLD
Aureobasidi Pullulans IgE: <0.1 kU/L
Candida Albicans IgE: <0.1 kU/L
M009-IgE Fusarium proliferatum: <0.1 kU/L
M014-IgE Epicoccum purpur: <0.1 kU/L
Phoma Betae IgE: <0.1 kU/L
Setomelanomma Rostrat: <0.1 kU/L

## 2020-08-29 ENCOUNTER — Ambulatory Visit: Payer: BC Managed Care – PPO | Admitting: Allergy & Immunology

## 2020-09-27 ENCOUNTER — Other Ambulatory Visit: Payer: Self-pay | Admitting: Allergy & Immunology

## 2020-09-27 ENCOUNTER — Other Ambulatory Visit: Payer: Self-pay

## 2020-09-27 NOTE — Telephone Encounter (Signed)
Sent in curtsey refill. Called and left a message for patient to call back and make an office visit appointment.

## 2020-10-01 ENCOUNTER — Other Ambulatory Visit: Payer: Self-pay | Admitting: Allergy & Immunology

## 2020-10-29 ENCOUNTER — Other Ambulatory Visit: Payer: Self-pay | Admitting: *Deleted

## 2020-10-29 MED ORDER — MONTELUKAST SODIUM 10 MG PO TABS
10.0000 mg | ORAL_TABLET | Freq: Every day | ORAL | 0 refills | Status: AC
  Filled 2021-09-19: qty 90, 90d supply, fill #0

## 2021-03-03 ENCOUNTER — Other Ambulatory Visit: Payer: Self-pay | Admitting: Allergy & Immunology

## 2021-05-22 ENCOUNTER — Ambulatory Visit: Payer: BC Managed Care – PPO | Admitting: Allergy & Immunology

## 2021-05-22 DIAGNOSIS — J309 Allergic rhinitis, unspecified: Secondary | ICD-10-CM

## 2021-09-18 ENCOUNTER — Other Ambulatory Visit (HOSPITAL_COMMUNITY): Payer: Self-pay

## 2021-09-19 ENCOUNTER — Other Ambulatory Visit (HOSPITAL_COMMUNITY): Payer: Self-pay

## 2021-09-19 MED ORDER — TOPIRAMATE 25 MG PO TABS: ORAL_TABLET | ORAL | 1 refills | Status: DC

## 2021-09-19 MED ORDER — LEVOTHYROXINE SODIUM 25 MCG PO TABS
ORAL_TABLET | ORAL | 3 refills | Status: AC
Start: 2020-12-01 — End: ?
  Filled 2021-09-19: qty 90, 90d supply, fill #0

## 2021-09-24 ENCOUNTER — Other Ambulatory Visit (HOSPITAL_COMMUNITY): Payer: Self-pay

## 2021-09-25 ENCOUNTER — Other Ambulatory Visit (HOSPITAL_COMMUNITY): Payer: Self-pay

## 2021-09-25 MED ORDER — NORETHIN ACE-ETH ESTRAD-FE 1.5-30 MG-MCG PO TABS
ORAL_TABLET | ORAL | 4 refills | Status: DC
  Filled 2021-09-25: qty 84, 84d supply, fill #0

## 2021-09-25 MED ORDER — PHENTERMINE HCL 15 MG PO CAPS
ORAL_CAPSULE | ORAL | 2 refills | Status: DC
  Filled 2021-09-25: qty 90, 90d supply, fill #0

## 2021-09-26 ENCOUNTER — Other Ambulatory Visit (HOSPITAL_COMMUNITY): Payer: Self-pay

## 2021-12-14 ENCOUNTER — Other Ambulatory Visit: Payer: Self-pay

## 2021-12-14 ENCOUNTER — Inpatient Hospital Stay (HOSPITAL_COMMUNITY): Payer: No Typology Code available for payment source

## 2021-12-14 ENCOUNTER — Inpatient Hospital Stay (HOSPITAL_COMMUNITY)
Admission: AD | Admit: 2021-12-14 | Discharge: 2021-12-14 | Disposition: A | Discharge status: Home / Self Care | Payer: No Typology Code available for payment source | Attending: Obstetrics | Admitting: Obstetrics

## 2021-12-14 ENCOUNTER — Encounter (HOSPITAL_COMMUNITY): Payer: Self-pay | Admitting: Obstetrics

## 2021-12-14 DIAGNOSIS — Z3A08 8 weeks gestation of pregnancy: Secondary | ICD-10-CM | POA: Diagnosis not present

## 2021-12-14 DIAGNOSIS — O3680X0 Pregnancy with inconclusive fetal viability, not applicable or unspecified: Secondary | ICD-10-CM | POA: Diagnosis not present

## 2021-12-14 DIAGNOSIS — O209 Hemorrhage in early pregnancy, unspecified: Secondary | ICD-10-CM

## 2021-12-14 LAB — CBC
HCT: 40.6 % (ref 36.0–46.0)
Hemoglobin: 13.6 g/dL (ref 12.0–15.0)
MCH: 30 pg (ref 26.0–34.0)
MCHC: 33.5 g/dL (ref 30.0–36.0)
MCV: 89.4 fL (ref 80.0–100.0)
Platelets: 342 10*3/uL (ref 150–400)
RBC: 4.54 MIL/uL (ref 3.87–5.11)
RDW: 11.8 % (ref 11.5–15.5)
WBC: 11.5 10*3/uL — ABNORMAL HIGH (ref 4.0–10.5)
nRBC: 0 % (ref 0.0–0.2)

## 2021-12-14 LAB — URINALYSIS, ROUTINE W REFLEX MICROSCOPIC
Bilirubin Urine: NEGATIVE
Glucose, UA: NEGATIVE mg/dL
Ketones, ur: NEGATIVE mg/dL
Leukocytes,Ua: NEGATIVE
Nitrite: NEGATIVE
Protein, ur: NEGATIVE mg/dL
RBC / HPF: >50 RBC/hpf — ABNORMAL HIGH (ref 0–5)
Specific Gravity, Urine: 1.014 (ref 1.005–1.030)
pH: 5 (ref 5.0–8.0)

## 2021-12-14 LAB — ABO/RH: ABO/RH(D): A POS

## 2021-12-14 LAB — WET PREP, GENITAL
Clue Cells Wet Prep HPF POC: NONE SEEN
Sperm: NONE SEEN
Trich, Wet Prep: NONE SEEN
WBC, Wet Prep HPF POC: >=10 — AB (ref <10)
Yeast Wet Prep HPF POC: NONE SEEN

## 2021-12-14 LAB — POCT PREGNANCY, URINE: Preg Test, Ur: POSITIVE — AB

## 2021-12-14 LAB — HCG, QUANTITATIVE, PREGNANCY: hCG, Beta Chain, Quant, S: 19 m[IU]/mL — ABNORMAL HIGH (ref <5)

## 2021-12-14 LAB — HIV ANTIBODY (ROUTINE TESTING W REFLEX): HIV Screen 4th Generation wRfx: NONREACTIVE

## 2021-12-14 NOTE — MAU Provider Note (Signed)
History     CSN: [id number]  Arrival date and time: 12/14/21 1507   Event Date/Time   First Provider Initiated Contact with Patient 12/14/21 1534      Chief Complaint  Patient presents with   Vaginal Bleeding   HPI Ms. Jaclyn Reynolds is a 26 y.o. year old G1P0 female at 8.[redacted] weeks gestation by uncertain LMP who presents to MAU reporting vaginal bleeding that started out as brown spotting 2 days ago and 5 (+) HPT since 12/06/2021. She reports she stopped taking OCP to TTC and has not had a period since 10/19/21. She took (+) HPT on 12/06/21. She has since taken 4 more (+) HPT; "the last one was a little faint positive." She describes her VB as brown and light with wiping 2 days ago. Now it has progressed to like a period today. She has been wearing a pad all day, but not saturating it. She only sees the bleeding when she wipes and a "few spots on pad." She denies any recent SI. [facility] Tuesday [facility]/16/2021. Her spouse is present and contributing to the history taking.    History reviewed. No pertinent past medical history.  Past Surgical History:  Procedure Laterality Date   BREAST REDUCTION SURGERY  2016   FRACTURE SURGERY Right 09-02-12   current   HARDWARE REMOVAL Right 04/21/2013   Procedure: HARDWARE REMOVAL OF RIGHT KNEE;  Surgeon: Loanne DrillingFrank V Aluisio, MD;  Location: [facility];  Service: Orthopedics;  Laterality: Right;   no previous surgery     had caps put on teeth age 35 in dentist office -probable sedation?   ORIF PATELLA  09/02/2012   Procedure: OPEN REDUCTION INTERNAL (ORIF) FIXATION PATELLA;  Surgeon: Loanne DrillingFrank V Aluisio, MD;  Location: [facility];  Service: Orthopedics;  Laterality: Right;    Family History  Problem Relation Age of Onset   Hypertension Mother    Hypertension Maternal Grandmother    Hyperlipidemia Maternal Grandmother    Hypertension Maternal Grandfather    Hyperlipidemia Maternal Grandfather     Social History   Tobacco Use    Smoking status: Never   Smokeless tobacco: Never  Vaping Use   Vaping Use: Never used  Substance Use Topics   Alcohol use: No   Drug use: No    Allergies:  Allergies  Allergen Reactions   Doxycycline Rash    Medications Prior to Admission  Medication Sig Dispense Refill Last Dose   cetirizine (ZYRTEC) 10 MG tablet Take 1 tablet (10 mg total) by mouth daily. 30 tablet 5    Fluticasone Propionate (XHANCE) 93 MCG/ACT EXHU Place 2 sprays into the nose in the morning and at bedtime. 32 mL 5    levothyroxine (SYNTHROID) 25 MCG tablet Take 25 mcg by mouth every morning.      levothyroxine (SYNTHROID) 25 MCG tablet Take 1 tablet by mouth in the morning on an empty stomach daily 90 tablet 3    montelukast (SINGULAIR) 10 MG tablet Take 1 tablet (10 mg total) by mouth daily. 90 tablet 0    Norethindrone Acetate-Ethinyl Estrad-FE (BLISOVI 24 FE) 1-20 MG-MCG(24) tablet       norethindrone-ethinyl estradiol-iron (LOESTRIN FE) 1.5-30 MG-MCG tablet Take 1 tablet by mouth once a day 84 tablet 4    phentermine 15 MG capsule Take 15 mg by mouth daily.      phentermine 15 MG capsule Take 1 capsule by mouth daily 90 capsule 2    topiramate (  TOPAMAX) 25 MG tablet Take 25 mg by mouth 2 (two) times daily.      topiramate (TOPAMAX) 25 MG tablet Take 1 tablet by mouth 2 times a day 180 tablet 1     Review of Systems  Constitutional: Negative.   HENT: Negative.    Eyes: Negative.   Respiratory: Negative.    Cardiovascular: Negative.   Gastrointestinal: Negative.   Endocrine: Negative.   Genitourinary:  Positive for vaginal bleeding (with wiping).  Musculoskeletal: Negative.   Skin: Negative.   Allergic/Immunologic: Negative.   Neurological: Negative.   Hematological: Negative.   Psychiatric/Behavioral: Negative.    Physical Exam   Blood pressure 120/67, pulse (!) 108, temperature 97.9 F (36.6 C), temperature source Oral, resp. rate 19, height 5\' 3"  (1.6 m), weight 101.9 kg, last menstrual  period 10/19/2021, SpO2 99 %.  Physical Exam Vitals and nursing note reviewed.  Constitutional:      Appearance: Normal appearance. She is obese.  Cardiovascular:     Rate and Rhythm: Tachycardia present.  Abdominal:     Palpations: Abdomen is soft.  Genitourinary:    Comments: Self-swab used to collect WP and GC/CT Skin:    General: Skin is warm and dry.  Neurological:     Mental Status: She is alert and oriented to person, place, and time.  Psychiatric:        Mood and Affect: Mood normal.        Behavior: Behavior normal.        Thought Content: Thought content normal.        Judgment: Judgment normal.   Multiple pictures seen on patient's phone documenting the amount of VB and the change in amount. Started out brown and light>light red and a little more>increased BRB and small amount with wiping. MAU Course  Procedures  MDM CCUA UPT CBC ABO/Rh HCG Wet Prep GC/CT -- pending HIV -- pending OB < 14 wks 13/05/2021 with TV  Results for orders placed or performed during the hospital encounter of 12/14/21 (from the past 24 hour(s))  Pregnancy, urine POC     Status: Abnormal   Collection Time: 12/14/21  3:19 PM  Result Value Ref Range   Preg Test, Ur POSITIVE (A) NEGATIVE  Urinalysis, Routine w reflex microscopic     Status: Abnormal   Collection Time: 12/14/21  3:30 PM  Result Value Ref Range   Color, Urine YELLOW YELLOW   APPearance HAZY (A) CLEAR   Specific Gravity, Urine 1.014 1.005 - 1.030   pH 5.0 5.0 - 8.0   Glucose, UA NEGATIVE NEGATIVE mg/dL   Hgb urine dipstick LARGE (A) NEGATIVE   Bilirubin Urine NEGATIVE NEGATIVE   Ketones, ur NEGATIVE NEGATIVE mg/dL   Protein, ur NEGATIVE NEGATIVE mg/dL   Nitrite NEGATIVE NEGATIVE   Leukocytes,Ua NEGATIVE NEGATIVE   RBC / HPF >50 (H) 0 - 5 RBC/hpf   WBC, UA 11-20 0 - 5 WBC/hpf   Bacteria, UA RARE (A) NONE SEEN   Squamous Epithelial / LPF 0-5 0 - 5   Mucus PRESENT   CBC     Status: Abnormal   Collection Time: 12/14/21   3:40 PM  Result Value Ref Range   WBC 11.5 (H) 4.0 - 10.5 K/uL   RBC 4.54 3.87 - 5.11 MIL/uL   Hemoglobin 13.6 12.0 - 15.0 g/dL   HCT 02/11/22 46.2 - 70.3 %   MCV 89.4 80.0 - 100.0 fL   MCH 30.0 26.0 - 34.0 pg   MCHC 33.5 30.0 -  36.0 g/dL   RDW 31.5 17.6 - 16.0 %   Platelets 342 150 - 400 K/uL   nRBC 0.0 0.0 - 0.2 %  ABO/Rh     Status: None   Collection Time: 12/14/21  3:40 PM  Result Value Ref Range   ABO/RH(D) A POS    No rh immune globuloin      NOT A RH IMMUNE GLOBULIN CANDIDATE, PT RH POSITIVE Performed at [facility], [address], [place], [place] [address]   hCG, quantitative, pregnancy     Status: Abnormal   Collection Time: 12/14/21  3:40 PM  Result Value Ref Range   hCG, Beta Chain, Quant, S 19 (H) <5 mIU/mL  HIV Antibody (routine testing w rflx)     Status: None   Collection Time: 12/14/21  3:40 PM  Result Value Ref Range   HIV Screen 4th Generation wRfx Non Reactive Non Reactive  Wet prep, genital     Status: Abnormal   Collection Time: 12/14/21  4:33 PM   Specimen: PATH Cytology Cervicovaginal Ancillary Only  Result Value Ref Range   Yeast Wet Prep HPF POC NONE SEEN NONE SEEN   Trich, Wet Prep NONE SEEN NONE SEEN   Clue Cells Wet Prep HPF POC NONE SEEN NONE SEEN   WBC, Wet Prep HPF POC >=10 (A) <10   Sperm NONE SEEN     US OB LESS THAN 14 WEEKS WITH OB TRANSVAGINAL  Result Date: 12/14/2021 CLINICAL DATA:  Vaginal bleeding. Last menstrual period 10/19/2021. Gestational age by last menstrual period 8 weeks and 0 days. Estimated due date by last menstrual period 07/26/2022. quantitative beta HCG 19. EXAM: OBSTETRIC <14 WK [place] AND TRANSVAGINAL OB US TECHNIQUE: Both transabdominal and transvaginal ultrasound examinations were performed for complete evaluation of the gestation as well as the maternal uterus, adnexal regions, and pelvic cul-de-sac. Transvaginal technique was performed to assess early pregnancy. COMPARISON:  None. FINDINGS: Intrauterine gestational  sac: None Yolk sac:  Not Visualized. Embryo:  Not Visualized. Cardiac Activity: Not Visualized. Maternal uterus/adnexae: The uterus is unremarkable. Endometrial thickness of 5 mm. Bilateral ovaries are unremarkable. Other: No free fluid within the pelvis. IMPRESSION: No intrauterine or ectopic pregnancy identified. Recommend correlation with trending of quantitative beta HCG and if clinically indicated follow-up ultrasound in 14 days. Electronically Signed   By: Tish Frederickson M.D.   On: 12/14/2021 18:13   Assessment and Plan  Pregnancy of unknown anatomic location  - Information provided on ectopic pregnancy  Vaginal bleeding affecting early pregnancy - Return to MAU: If you have heavier bleeding that soaks through more that 2 pads per hour for an hour or more If you bleed so much that you feel like you might pass out or you do pass out If you have significant abdominal pain that is not improved with Tylenol 1000 mg every 8 hours as needed for pain If you develop a fever > 100.5   - Discharge patient - Keep scheduled appointment with [facility] on 12/16/2021 - Needs HCG level repeated on 12/16/2021 and repeat U/S - Patient verbalized an understanding of the plan of care and agrees.   Raelyn Mora, CNM 12/14/2021, 5:18 PM

## 2021-12-14 NOTE — MAU Note (Signed)
Presents with c/o brown spotting x2 days, reports now having bright red spotting with wiping.  Denies recent intercourse.  LMP 10/19/2021.  +HPT.

## 2021-12-14 NOTE — Discharge Instructions (Signed)
Return to [facility]U: If you have heavier bleeding that soaks through more that 2 pads per hour for an hour or more If you bleed so much that you feel like you might pass out or you do pass out If you have significant abdominal pain that is not improved with Tylenol 1000 mg every 8 hours as needed for pain If you develop a fever > 100.5  

## 2021-12-16 LAB — GC/CHLAMYDIA PROBE AMP (~~LOC~~) NOT AT ARMC
Chlamydia: NEGATIVE
Comment: NEGATIVE
Comment: NORMAL
Neisseria Gonorrhea: NEGATIVE

## 2021-12-30 ENCOUNTER — Other Ambulatory Visit (HOSPITAL_COMMUNITY): Payer: Self-pay

## 2022-01-01 ENCOUNTER — Other Ambulatory Visit (HOSPITAL_COMMUNITY): Payer: Self-pay

## 2022-01-01 ENCOUNTER — Other Ambulatory Visit: Payer: Self-pay | Admitting: Allergy & Immunology

## 2022-01-02 ENCOUNTER — Other Ambulatory Visit (HOSPITAL_COMMUNITY): Payer: Self-pay

## 2022-01-03 ENCOUNTER — Other Ambulatory Visit (HOSPITAL_COMMUNITY): Payer: Self-pay

## 2022-01-05 ENCOUNTER — Other Ambulatory Visit (HOSPITAL_COMMUNITY): Payer: Self-pay

## 2022-01-26 ENCOUNTER — Other Ambulatory Visit (HOSPITAL_COMMUNITY): Payer: Self-pay

## 2022-01-26 MED ORDER — LEVOTHYROXINE SODIUM 25 MCG PO TABS
ORAL_TABLET | ORAL | 3 refills | Status: AC
Start: 2022-01-26 — End: ?
  Filled 2022-01-26: qty 90, 90d supply, fill #0
  Filled 2022-05-07: qty 90, 90d supply, fill #1
  Filled 2022-08-25: qty 90, 90d supply, fill #2
  Filled 2022-12-30: qty 90, 90d supply, fill #3

## 2022-01-28 ENCOUNTER — Other Ambulatory Visit (HOSPITAL_COMMUNITY): Payer: Self-pay

## 2022-02-05 ENCOUNTER — Other Ambulatory Visit (HOSPITAL_COMMUNITY): Payer: Self-pay

## 2022-05-07 ENCOUNTER — Other Ambulatory Visit (HOSPITAL_COMMUNITY): Payer: Self-pay

## 2022-05-25 LAB — OB RESULTS CONSOLE HIV ANTIBODY (ROUTINE TESTING): HIV: NONREACTIVE

## 2022-05-25 LAB — HEPATITIS C ANTIBODY: HCV Ab: NEGATIVE

## 2022-05-25 LAB — OB RESULTS CONSOLE GC/CHLAMYDIA
Chlamydia: NEGATIVE
Neisseria Gonorrhea: NEGATIVE

## 2022-05-25 LAB — OB RESULTS CONSOLE RUBELLA ANTIBODY, IGM: Rubella: IMMUNE

## 2022-05-25 LAB — OB RESULTS CONSOLE ABO/RH: RH Type: POSITIVE

## 2022-05-25 LAB — OB RESULTS CONSOLE ANTIBODY SCREEN: Antibody Screen: NEGATIVE

## 2022-05-25 LAB — OB RESULTS CONSOLE RPR: RPR: NONREACTIVE

## 2022-05-25 LAB — OB RESULTS CONSOLE HEPATITIS B SURFACE ANTIGEN: Hepatitis B Surface Ag: NEGATIVE

## 2022-06-11 ENCOUNTER — Other Ambulatory Visit (HOSPITAL_COMMUNITY): Payer: Self-pay

## 2022-06-11 MED ORDER — AMOXICILLIN 875 MG PO TABS
ORAL_TABLET | ORAL | 0 refills | Status: DC
  Filled 2022-06-11: qty 14, 7d supply, fill #0

## 2022-08-27 ENCOUNTER — Other Ambulatory Visit (HOSPITAL_COMMUNITY): Payer: Self-pay

## 2022-08-28 ENCOUNTER — Other Ambulatory Visit (HOSPITAL_COMMUNITY): Payer: Self-pay

## 2022-11-05 IMAGING — US US OB < 14 WEEKS - US OB TV
1 series · 15 of 15 positions shown · non-contrast
Comparison: None.

CLINICAL DATA: Vaginal bleeding. Last menstrual period 10/19/2021.
Gestational age by last menstrual period 8 weeks and 0 days.
Estimated due date by last menstrual period 07/26/2022. quantitative
beta HCG 19.

EXAM:
OBSTETRIC <14 WK US AND TRANSVAGINAL OB US
TECHNIQUE: Both transabdominal and transvaginal ultrasound examinations were
performed for complete evaluation of the gestation as well as the
maternal uterus, adnexal regions, and pelvic cul-de-sac.
Transvaginal technique was performed to assess early pregnancy.

[Series 1: us ob < 14 weeks - us ob tv · 15 acquisitions, 15 frames shown]
[im 1/15]
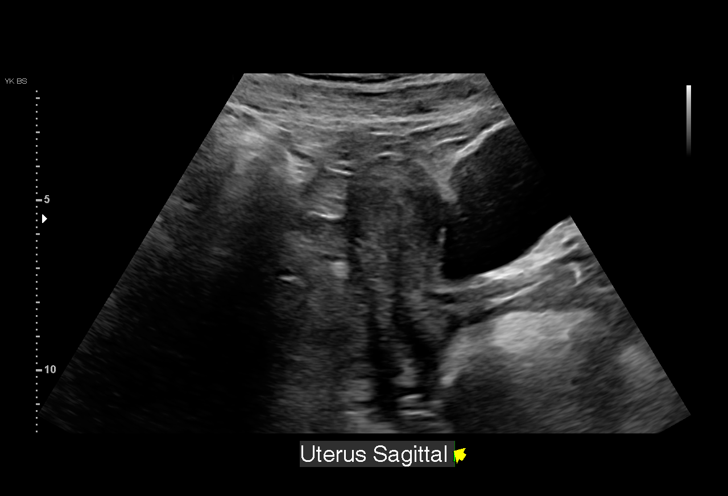
[im 2/15]
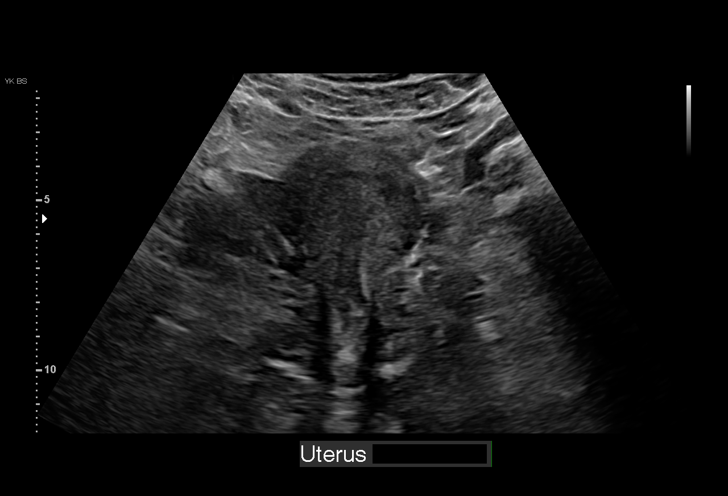
[im 3/15]
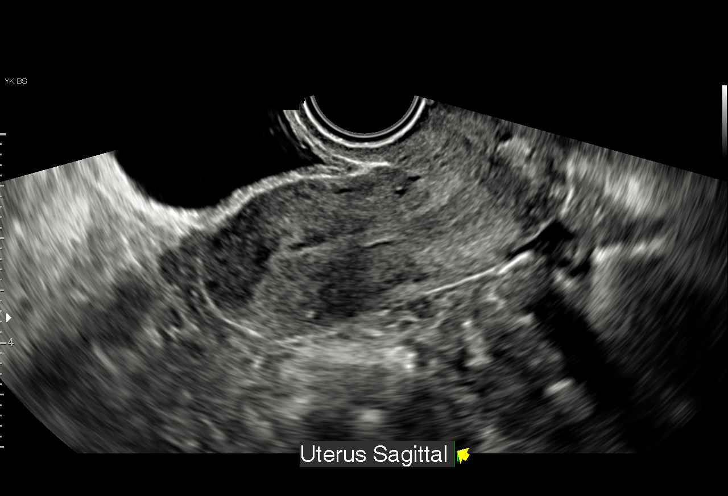
[im 4/15]
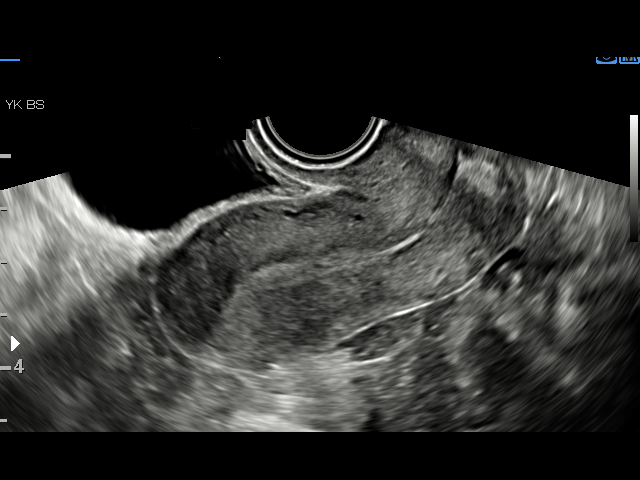
[im 5/15]
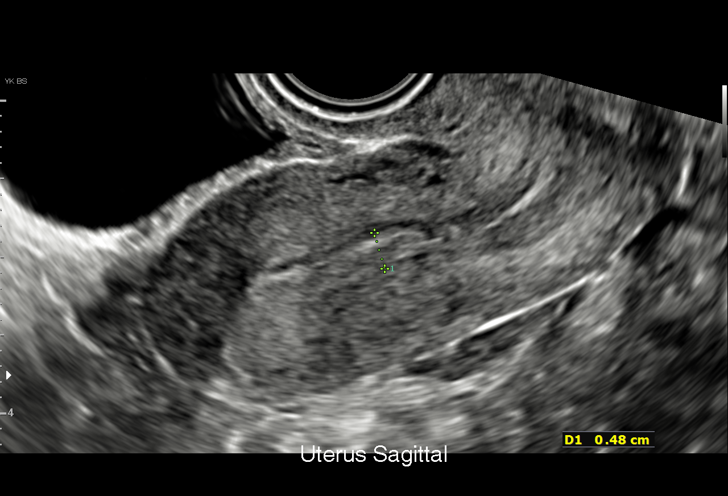
[im 6/15]
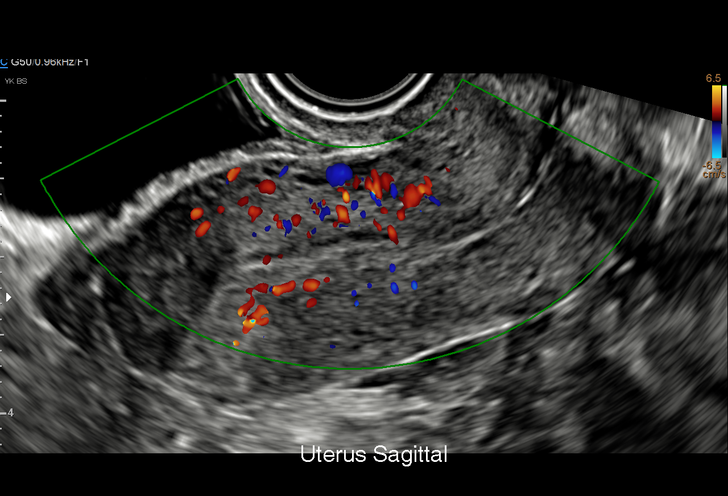
[im 7/15]
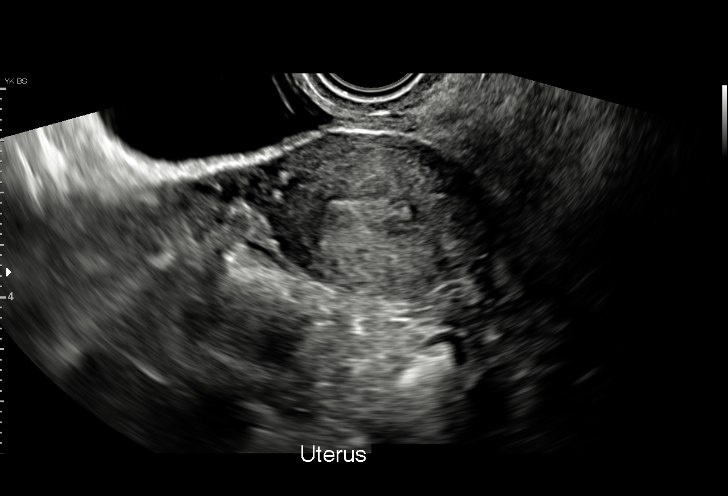
[im 8/15]
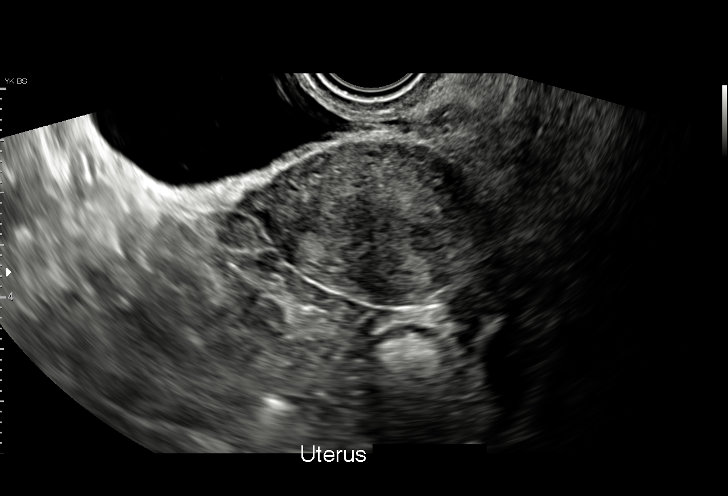
[im 9/15]
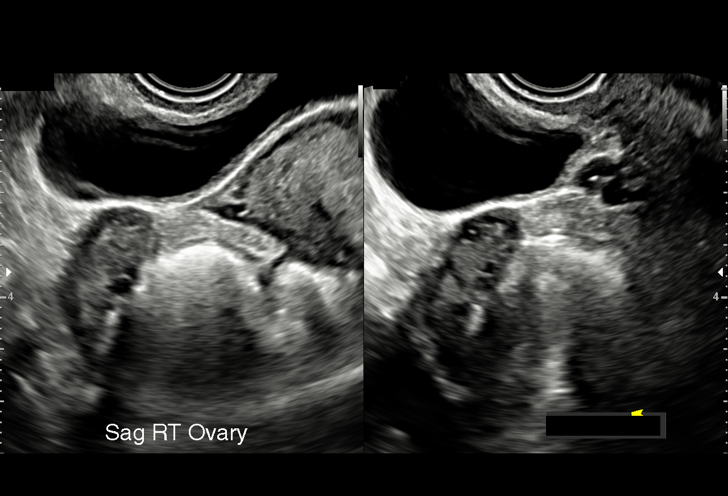
[im 10/15]
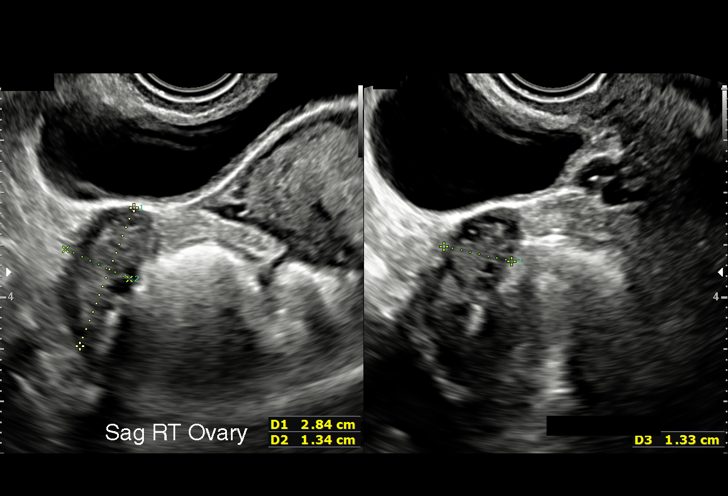
[im 11/15]
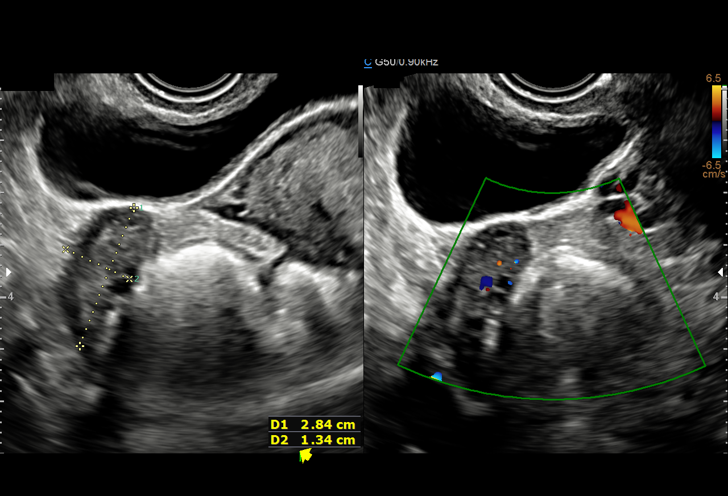
[im 12/15]
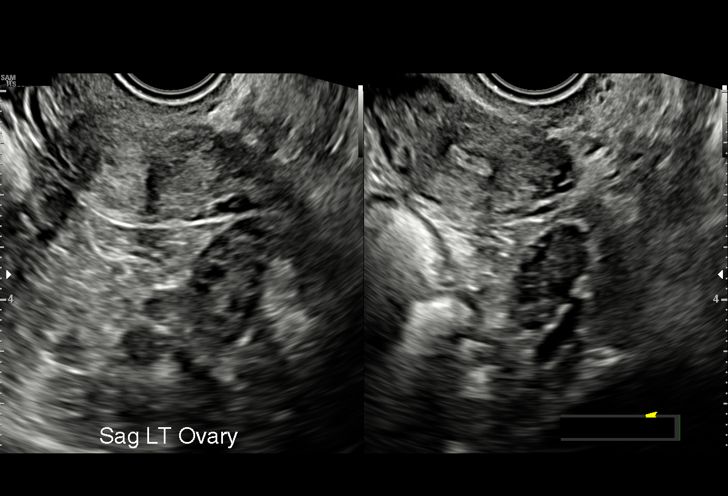
[im 13/15]
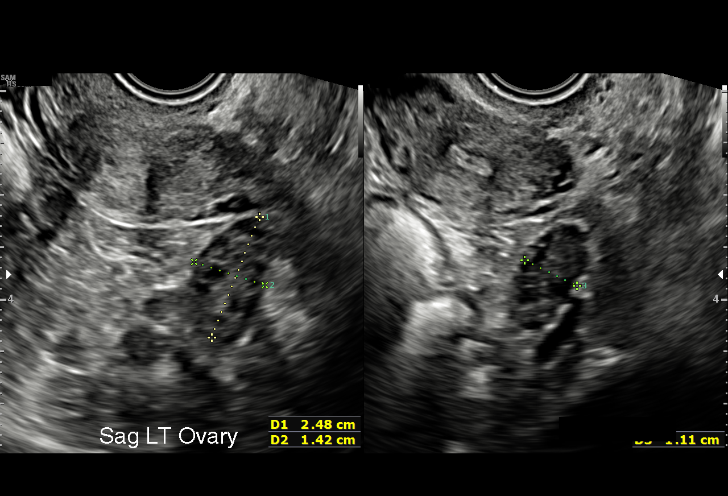
[im 14/15]
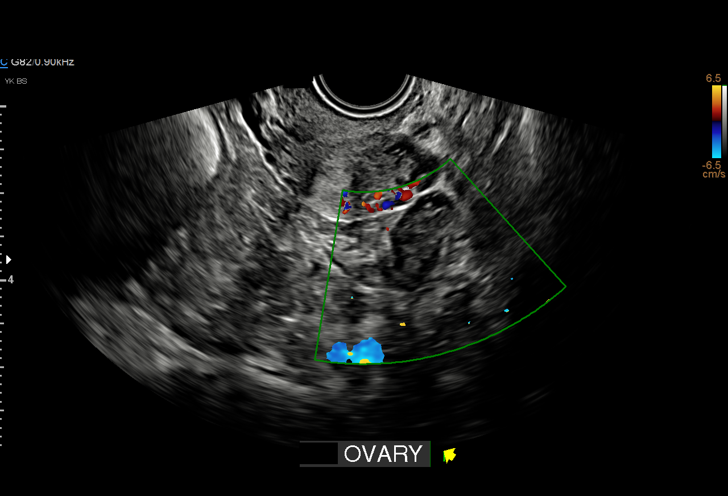
[im 15/15]
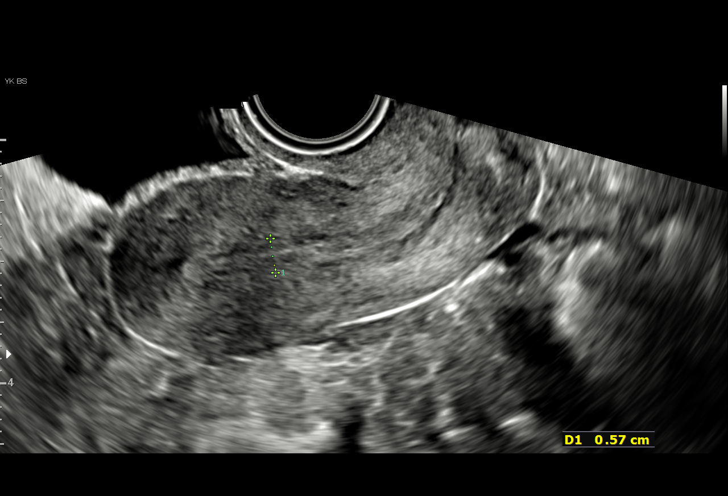

[15 of 15 positions shown; findings below may reference images not displayed]

FINDINGS: Intrauterine gestational sac: None

Yolk sac:  Not Visualized.

Embryo:  Not Visualized.

Cardiac Activity: Not Visualized.

Maternal uterus/adnexae: The uterus is unremarkable. Endometrial
thickness of 5 mm. Bilateral ovaries are unremarkable.

Other: No free fluid within the pelvis.
IMPRESSION: No intrauterine or ectopic pregnancy identified. Recommend
correlation with trending of quantitative beta HCG and if clinically
indicated follow-up ultrasound in 14 days.

## 2022-11-18 LAB — OB RESULTS CONSOLE GBS: GBS: NEGATIVE

## 2022-12-01 ENCOUNTER — Encounter (HOSPITAL_COMMUNITY): Payer: Self-pay | Admitting: *Deleted

## 2022-12-01 ENCOUNTER — Telehealth (HOSPITAL_COMMUNITY): Payer: Self-pay | Admitting: *Deleted

## 2022-12-01 NOTE — Telephone Encounter (Signed)
Preadmission screen  

## 2022-12-03 ENCOUNTER — Encounter (HOSPITAL_COMMUNITY): Payer: Self-pay

## 2022-12-04 ENCOUNTER — Telehealth (HOSPITAL_COMMUNITY): Payer: Self-pay | Admitting: *Deleted

## 2022-12-04 NOTE — Telephone Encounter (Signed)
Preadmission screen  

## 2022-12-08 ENCOUNTER — Encounter (HOSPITAL_COMMUNITY): Payer: Self-pay | Admitting: *Deleted

## 2022-12-08 ENCOUNTER — Telehealth (HOSPITAL_COMMUNITY): Payer: Self-pay | Admitting: *Deleted

## 2022-12-08 NOTE — Telephone Encounter (Signed)
Preadmission screen  

## 2022-12-09 ENCOUNTER — Inpatient Hospital Stay (HOSPITAL_COMMUNITY)
Admission: AD | Admit: 2022-12-09 | Discharge: 2022-12-13 | DRG: 788 | Disposition: A | Discharge status: Home / Self Care | Payer: No Typology Code available for payment source | Attending: Obstetrics and Gynecology | Admitting: Obstetrics and Gynecology

## 2022-12-09 ENCOUNTER — Other Ambulatory Visit (HOSPITAL_COMMUNITY): Payer: Self-pay

## 2022-12-09 DIAGNOSIS — O322XX0 Maternal care for transverse and oblique lie, not applicable or unspecified: Secondary | ICD-10-CM | POA: Diagnosis present

## 2022-12-09 DIAGNOSIS — O9081 Anemia of the puerperium: Secondary | ICD-10-CM | POA: Diagnosis not present

## 2022-12-09 DIAGNOSIS — Z3A39 39 weeks gestation of pregnancy: Secondary | ICD-10-CM | POA: Diagnosis not present

## 2022-12-09 DIAGNOSIS — O99344 Other mental disorders complicating childbirth: Secondary | ICD-10-CM | POA: Diagnosis present

## 2022-12-09 DIAGNOSIS — E039 Hypothyroidism, unspecified: Secondary | ICD-10-CM | POA: Diagnosis present

## 2022-12-09 DIAGNOSIS — O403XX0 Polyhydramnios, third trimester, not applicable or unspecified: Principal | ICD-10-CM | POA: Diagnosis present

## 2022-12-09 DIAGNOSIS — F419 Anxiety disorder, unspecified: Secondary | ICD-10-CM | POA: Diagnosis present

## 2022-12-09 DIAGNOSIS — O99214 Obesity complicating childbirth: Secondary | ICD-10-CM | POA: Diagnosis present

## 2022-12-09 DIAGNOSIS — O99284 Endocrine, nutritional and metabolic diseases complicating childbirth: Secondary | ICD-10-CM | POA: Diagnosis present

## 2022-12-09 DIAGNOSIS — E079 Disorder of thyroid, unspecified: Secondary | ICD-10-CM | POA: Diagnosis present

## 2022-12-09 DIAGNOSIS — E669 Obesity, unspecified: Secondary | ICD-10-CM | POA: Diagnosis not present

## 2022-12-09 HISTORY — DX: Hypothyroidism, unspecified: E03.9

## 2022-12-09 MED ORDER — ALPRAZOLAM 0.5 MG PO TABS
0.5000 mg | ORAL_TABLET | Freq: Every day | ORAL | 0 refills | Status: DC
  Filled 2022-12-09: qty 1, 1d supply, fill #0

## 2022-12-10 ENCOUNTER — Inpatient Hospital Stay (HOSPITAL_COMMUNITY): Payer: No Typology Code available for payment source | Admitting: Anesthesiology

## 2022-12-10 ENCOUNTER — Other Ambulatory Visit: Payer: Self-pay

## 2022-12-10 ENCOUNTER — Ambulatory Visit (HOSPITAL_COMMUNITY)
Admission: RE | Admit: 2022-12-10 | Discharge: 2022-12-10 | Disposition: A | Discharge status: Home / Self Care | Payer: No Typology Code available for payment source | Source: Ambulatory Visit | Attending: Obstetrics and Gynecology | Admitting: Obstetrics and Gynecology

## 2022-12-10 ENCOUNTER — Encounter (HOSPITAL_COMMUNITY): Payer: Self-pay | Admitting: Obstetrics and Gynecology

## 2022-12-10 DIAGNOSIS — O403XX0 Polyhydramnios, third trimester, not applicable or unspecified: Principal | ICD-10-CM | POA: Diagnosis present

## 2022-12-10 LAB — TYPE AND SCREEN
ABO/RH(D): A POS
Antibody Screen: NEGATIVE

## 2022-12-10 LAB — CBC
HCT: 35.3 % — ABNORMAL LOW (ref 36.0–46.0)
Hemoglobin: 11.7 g/dL — ABNORMAL LOW (ref 12.0–15.0)
MCH: 28.4 pg (ref 26.0–34.0)
MCHC: 33.1 g/dL (ref 30.0–36.0)
MCV: 85.7 fL (ref 80.0–100.0)
Platelets: 263 10*3/uL (ref 150–400)
RBC: 4.12 MIL/uL (ref 3.87–5.11)
RDW: 13.5 % (ref 11.5–15.5)
WBC: 10.5 10*3/uL (ref 4.0–10.5)
nRBC: 0 % (ref 0.0–0.2)

## 2022-12-10 LAB — RPR: RPR Ser Ql: NONREACTIVE

## 2022-12-10 LAB — HIV ANTIBODY (ROUTINE TESTING W REFLEX): HIV Screen 4th Generation wRfx: NONREACTIVE

## 2022-12-10 MED ORDER — LEVOTHYROXINE SODIUM 50 MCG PO TABS
25.0000 ug | ORAL_TABLET | Freq: Every day | ORAL | Status: DC
  Administered 2022-12-10: 25 ug via ORAL
  Filled 2022-12-10 (×2): qty 1

## 2022-12-10 MED ORDER — OXYTOCIN BOLUS FROM INFUSION: 333.0000 mL | Freq: Once | INTRAVENOUS | Status: DC

## 2022-12-10 MED ORDER — EPHEDRINE 5 MG/ML INJ: 10.0000 mg | INTRAVENOUS | Status: DC | PRN

## 2022-12-10 MED ORDER — OXYCODONE-ACETAMINOPHEN 5-325 MG PO TABS: 2.0000 | ORAL_TABLET | ORAL | Status: DC | PRN

## 2022-12-10 MED ORDER — LACTATED RINGERS IV SOLN
500.0000 mL | INTRAVENOUS | Status: DC | PRN
  Administered 2022-12-10: 500 mL via INTRAVENOUS

## 2022-12-10 MED ORDER — DIPHENHYDRAMINE HCL 50 MG/ML IJ SOLN: 12.5000 mg | INTRAMUSCULAR | Status: DC | PRN

## 2022-12-10 MED ORDER — FENTANYL-BUPIVACAINE-NACL 0.5-0.125-0.9 MG/250ML-% EP SOLN
12.0000 mL/h | EPIDURAL | Status: DC | PRN
  Administered 2022-12-10 – 2022-12-11 (×2): 12 mL/h via EPIDURAL
  Filled 2022-12-10 (×2): qty 250

## 2022-12-10 MED ORDER — PHENYLEPHRINE 80 MCG/ML (10ML) SYRINGE FOR IV PUSH (FOR BLOOD PRESSURE SUPPORT): 80.0000 ug | PREFILLED_SYRINGE | INTRAVENOUS | Status: DC | PRN

## 2022-12-10 MED ORDER — MISOPROSTOL 50MCG HALF TABLET
50.0000 ug | ORAL_TABLET | ORAL | Status: DC | PRN
  Administered 2022-12-10 (×2): 50 ug via ORAL
  Filled 2022-12-10 (×2): qty 1

## 2022-12-10 MED ORDER — ALPRAZOLAM 0.5 MG PO TABS
0.5000 mg | ORAL_TABLET | Freq: Three times a day (TID) | ORAL | Status: DC | PRN
  Administered 2022-12-10: 0.5 mg via ORAL
  Filled 2022-12-10: qty 1

## 2022-12-10 MED ORDER — OXYTOCIN-SODIUM CHLORIDE 30-0.9 UT/500ML-% IV SOLN
1.0000 m[IU]/min | INTRAVENOUS | Status: DC
  Administered 2022-12-10: 2 m[IU]/min via INTRAVENOUS

## 2022-12-10 MED ORDER — SOD CITRATE-CITRIC ACID 500-334 MG/5ML PO SOLN
30.0000 mL | ORAL | Status: DC | PRN
  Filled 2022-12-10 (×2): qty 30

## 2022-12-10 MED ORDER — LORATADINE 10 MG PO TABS
10.0000 mg | ORAL_TABLET | Freq: Every day | ORAL | Status: DC
  Administered 2022-12-10: 10 mg via ORAL
  Filled 2022-12-10 (×2): qty 1

## 2022-12-10 MED ORDER — LACTATED RINGERS IV SOLN: INTRAVENOUS | Status: DC

## 2022-12-10 MED ORDER — ONDANSETRON HCL 4 MG/2ML IJ SOLN: 4.0000 mg | Freq: Four times a day (QID) | INTRAMUSCULAR | Status: DC | PRN

## 2022-12-10 MED ORDER — LIDOCAINE HCL (PF) 1 % IJ SOLN: 30.0000 mL | INTRAMUSCULAR | Status: DC | PRN

## 2022-12-10 MED ORDER — TERBUTALINE SULFATE 1 MG/ML IJ SOLN: 0.2500 mg | Freq: Once | INTRAMUSCULAR | Status: DC | PRN

## 2022-12-10 MED ORDER — OXYTOCIN-SODIUM CHLORIDE 30-0.9 UT/500ML-% IV SOLN
2.5000 [IU]/h | INTRAVENOUS | Status: DC
  Filled 2022-12-10: qty 500

## 2022-12-10 MED ORDER — ACETAMINOPHEN 325 MG PO TABS
650.0000 mg | ORAL_TABLET | ORAL | Status: DC | PRN
  Administered 2022-12-10: 650 mg via ORAL
  Filled 2022-12-10: qty 2

## 2022-12-10 MED ORDER — LIDOCAINE HCL (PF) 1 % IJ SOLN
INTRAMUSCULAR | Status: DC | PRN
  Administered 2022-12-10: 10 mL via EPIDURAL

## 2022-12-10 MED ORDER — LACTATED RINGERS IV SOLN
500.0000 mL | Freq: Once | INTRAVENOUS | Status: AC
  Administered 2022-12-10: 500 mL via INTRAVENOUS

## 2022-12-10 MED ORDER — MISOPROSTOL 50MCG HALF TABLET
50.0000 ug | ORAL_TABLET | ORAL | Status: DC | PRN
  Administered 2022-12-10: 50 ug via BUCCAL
  Filled 2022-12-10: qty 1

## 2022-12-10 MED ORDER — OXYCODONE-ACETAMINOPHEN 5-325 MG PO TABS: 1.0000 | ORAL_TABLET | ORAL | Status: DC | PRN

## 2022-12-10 MED ORDER — FENTANYL CITRATE (PF) 100 MCG/2ML IJ SOLN: 50.0000 ug | INTRAMUSCULAR | Status: DC | PRN

## 2022-12-10 NOTE — Progress Notes (Signed)
Jaclyn Reynolds is a 26 y.o. G1P0 at [redacted]w[redacted]d  Subjective: Pt took a xanax about ago in preparation for sve. Feels ready. +FMs. Contractions still mild. 3rd dose of oral cytotec was 4hrs ago.   Objective: BP 134/79   Pulse 99   Temp 98.2 F (36.8 C) (Oral)   Resp 16   Ht 5\' 3"  (1.6 m)   Wt 121.8 kg   LMP 10/19/2021   BMI 47.58 kg/m  No intake/output data recorded. No intake/output data recorded.  FHT:  FHR: 120 bpm, variability: moderate,  accelerations:  Present,  decelerations:  Absent UC:   irregular, every 1-6 minutes SVE:   Dilation: 3 Effacement (%): Thick Station: -3 Exam by:: Dr. [email or web address]  Labs: Lab Results  Component Value Date   WBC 10.5 12/10/2022   HGB 11.7 (L) 12/10/2022   HCT 35.3 (L) 12/10/2022   MCV 85.7 12/10/2022   PLT 263 12/10/2022    Assessment / Plan: Induction of labor due to polyhydramnious,  progressing well on pitocin  Labor:  progressing well on pitocin. Still with anxiety related to sve hence difficult to fully assess station; vertex palpated with fundal pressure applied. Had discussion with pt and will plan on her getting an epidural and then attempt AROM - controlled.  Preeclampsia:   n/a Fetal Wellbeing:  Category I Pain Control:  Labor support without medications I/D:  n/a Anticipated MOD:  NSVD  12/12/2022, DO 12/10/2022, 2:59 PM

## 2022-12-10 NOTE — Progress Notes (Signed)
Patient ID: Jaclyn Reynolds, female   DOB: 1996-03-23, 26 y.o.   MRN: [id number] 1.5cm dil but -3 station  Plan to wait to AROM till vertex lower  Plan to give xanax prior to next sve

## 2022-12-10 NOTE — H&P (Addendum)
Jaclyn Reynolds is a 26 y.o.G2P0010  female presenting for IOL due to polyhydramnious. She is dated per 6 weeks [place]. Her pregnancy has also been complicated by thyroid disorder - stable on levothyroxine and maternal obesity.  She is GBS neg. NIPT expected range  Pt has anxiety related to sve- no history of trauma. Took xanax given last night prior to admission OB History     Gravida  1   Para      Term      Preterm      AB      Living         SAB      IAB      Ectopic      Multiple      Live Births             Past Medical History:  Diagnosis Date   Hypothyroidism    Past Surgical History:  Procedure Laterality Date   BREAST REDUCTION SURGERY  2016   FRACTURE SURGERY Right 09-02-12   current   HARDWARE REMOVAL Right 04/21/2013   Procedure: HARDWARE REMOVAL OF RIGHT KNEE;  Surgeon: Loanne Drilling, MD;  Location: [facility];  Service: Orthopedics;  Laterality: Right;   no previous surgery     had caps put on teeth age 33 in dentist office -probable sedation?   ORIF PATELLA  09/02/2012   Procedure: OPEN REDUCTION INTERNAL (ORIF) FIXATION PATELLA;  Surgeon: Loanne Drilling, MD;  Location: [facility];  Service: Orthopedics;  Laterality: Right;   Family History: family history includes Hyperlipidemia in her maternal grandfather and maternal grandmother; Hypertension in her maternal grandfather, maternal grandmother, and mother. Social History:  reports that she has never smoked. She has never used smokeless tobacco. She reports that she does not drink alcohol and does not use drugs.     Maternal Diabetes: No Genetic Screening: Normal Maternal Ultrasounds/Referrals: Normal Fetal Ultrasounds or other Referrals:  None Maternal Substance Abuse:  No Significant Maternal Medications:  None Significant Maternal Lab Results:  Group B Strep negative Number of Prenatal Visits:greater than 3 verified prenatal visits Other Comments:  None  Review of Systems  Constitutional:  Positive  for fatigue. Negative for activity change.  Eyes:  Negative for photophobia and visual disturbance.  Respiratory:  Negative for chest tightness and shortness of breath.   Cardiovascular:  Positive for leg swelling. Negative for chest pain and palpitations.  Gastrointestinal:  Negative for abdominal pain.  Genitourinary:  Negative for pelvic pain, vaginal discharge and vaginal pain.  Musculoskeletal:  Negative for back pain.  Neurological:  Negative for light-headedness and headaches.  Psychiatric/Behavioral:  The patient is nervous/anxious.    Maternal Medical History:  Reason for admission: polyhydramnious  Contractions: Frequency: rare.   Perceived severity is mild.   Fetal activity: Perceived fetal activity is normal.   Prenatal complications: Polyhydramnios.   Prenatal Complications - Diabetes: none.   Dilation: 1 Effacement (%): Thick Station: Ballotable Exam by:: Adline Potter, RN Blood pressure 136/64, pulse 83, temperature 98.6 F (37 C), temperature source Oral, resp. rate 18, height 5\' 3"  (1.6 m), weight 121.8 kg, last menstrual period 10/19/2021. Maternal Exam:  Uterine Assessment: Contraction strength is mild.  Contraction frequency is rare.  Abdomen: Patient reports generalized tenderness.  Estimated fetal weight is AGA.   Fetal presentation: vertex Introitus: Normal vulva. Vulva is negative for condylomata and lesion.  Normal vagina.  Vagina is negative for condylomata.  Pelvis: adequate for delivery.   Cervix: Cervix  evaluated by digital exam.     Fetal Exam Fetal Monitor Review: Baseline rate: 140.  Variability: moderate (6-25 bpm).   Pattern: accelerations present and no decelerations.   Fetal State Assessment: Category I - tracings are normal.   Physical Exam Vitals and nursing note reviewed. Exam conducted with a chaperone present.  Constitutional:      Appearance: Normal appearance. She is normal weight.  Pulmonary:     Effort: Pulmonary effort is  normal.  Abdominal:     Tenderness: There is generalized abdominal tenderness.  Genitourinary:    General: Normal vulva.  Vulva is no lesion.  Musculoskeletal:        General: Normal range of motion.     Cervical back: Normal range of motion.  Skin:    General: Skin is warm and dry.     Capillary Refill: Capillary refill takes 2 to 3 seconds.  Neurological:     General: No focal deficit present.     Mental Status: She is alert and oriented to person, place, and time. Mental status is at baseline.  Psychiatric:        Mood and Affect: Mood normal.        Behavior: Behavior normal.        Thought Content: Thought content normal.        Judgment: Judgment normal.     Prenatal labs: ABO, Rh: --/--/A POS (12/28 0017) Antibody: NEG (12/28 0017) Rubella: Immune (06/12 0000) RPR: NON REACTIVE (12/28 0026)  HBsAg: Negative (06/12 0000)  HIV: Non Reactive (12/28 0026)  GBS: Negative/-- (12/06 0000)   Assessment/Plan: 26yo G74P0010 female here for IOL due to polyhydramnious - Admit - S/P oral cytotec overnight - Pain control prn  - Anxiety with pelvic exams - AROM and augmentation with pitocin prn  - Anticipate svd    Jaclyn Reynolds W Jaclyn Reynolds 12/10/2022, 9:35 AM

## 2022-12-10 NOTE — Anesthesia Preprocedure Evaluation (Addendum)
Anesthesia Evaluation  Patient identified by MRN, date of birth, ID band Patient awake    Reviewed: Allergy & Precautions, H&P , NPO status , Patient's Chart, lab work & pertinent test results  Airway Mallampati: II       Dental no notable dental hx.    Pulmonary neg pulmonary ROS   Pulmonary exam normal        Cardiovascular negative cardio ROS Normal cardiovascular exam     Neuro/Psych negative neurological ROS  negative psych ROS   GI/Hepatic negative GI ROS, Neg liver ROS,,,  Endo/Other  Hypothyroidism  Morbid obesity  Renal/GU negative Renal ROS  negative genitourinary   Musculoskeletal negative musculoskeletal ROS (+)    Abdominal   Peds  Hematology negative hematology ROS (+)   Anesthesia Other Findings   Reproductive/Obstetrics (+) Pregnancy                             Anesthesia Physical Anesthesia Plan  ASA: 3 and emergent  Anesthesia Plan: Epidural   Post-op Pain Management:    Induction:   PONV Risk Score and Plan: 2 and Ondansetron and Dexamethasone  Airway Management Planned: Natural Airway  Additional Equipment:   Intra-op Plan:   Post-operative Plan:   Informed Consent: I have reviewed the patients History and Physical, chart, labs and discussed the procedure including the risks, benefits and alternatives for the proposed anesthesia with the patient or authorized representative who has indicated his/her understanding and acceptance.       Plan Discussed with:   Anesthesia Plan Comments: (Epidural to C-section for failure to progress)       Anesthesia Quick Evaluation

## 2022-12-10 NOTE — Progress Notes (Signed)
Jaclyn Reynolds is a 26 y.o. G1P0 at [redacted]w[redacted]d   Subjective: Pt is comfortable with epidural. No new complaints. +Fms  Objective: BP 120/80   Pulse 79   Temp 97.6 F (36.4 C) (Oral)   Resp 16   Ht 5\' 3"  (1.6 m)   Wt 121.8 kg   LMP 10/19/2021   SpO2 97%   BMI 47.58 kg/m  No intake/output data recorded. No intake/output data recorded.  FHT:  FHR: 130 bpm, variability: moderate,  accelerations:  Present,  decelerations:  Absent UC:   regular, every 2 minutes SVE:   Dilation: 5.5 Effacement (%): 80 Station: -2 Exam by:: [email or web address]  Labs: Lab Results  Component Value Date   WBC 10.5 12/10/2022   HGB 11.7 (L) 12/10/2022   HCT 35.3 (L) 12/10/2022   MCV 85.7 12/10/2022   PLT 263 12/10/2022    Assessment / Plan: Induction of labor due to polyhydramnious,  progressing well on pitocin  Labor:  progressing well, IUPC placed pitocin per protocol  Preeclampsia:   n/a Fetal Wellbeing:  Category I Pain Control:  Epidural I/D:  n/a Anticipated MOD:  NSVD  12/12/2022, DO 12/10/2022, 10:06 PM

## 2022-12-10 NOTE — Anesthesia Procedure Notes (Signed)
Epidural Patient location during procedure: OB Start time: 12/10/2022 3:25 PM End time: 12/10/2022 3:29 PM  Staffing Anesthesiologist: Leilani Able, MD Performed: anesthesiologist   Preanesthetic Checklist Completed: patient identified, IV checked, site marked, risks and benefits discussed, surgical consent, monitors and equipment checked, pre-op evaluation and timeout performed  Epidural Patient position: sitting Prep: DuraPrep and site prepped and draped Patient monitoring: continuous pulse ox and blood pressure Approach: midline Location: L3-L4 Injection technique: LOR air  Needle:  Needle type: Tuohy  Needle gauge: 17 G Needle length: 9 cm and 9 Needle insertion depth: 8 cm Catheter type: closed end flexible Catheter size: 19 Gauge Catheter at skin depth: 14 cm Test dose: negative and Other  Assessment Events: blood not aspirated, no cerebrospinal fluid, injection not painful, no injection resistance, no paresthesia and negative IV test  Additional Notes Reason for block:procedure for pain

## 2022-12-11 ENCOUNTER — Encounter (HOSPITAL_COMMUNITY): Payer: Self-pay | Admitting: Obstetrics and Gynecology

## 2022-12-11 ENCOUNTER — Encounter (HOSPITAL_COMMUNITY)
Admission: AD | Disposition: A | Discharge status: Home / Self Care | Payer: Self-pay | Source: Home / Self Care | Attending: Obstetrics and Gynecology

## 2022-12-11 ENCOUNTER — Other Ambulatory Visit: Payer: Self-pay

## 2022-12-11 DIAGNOSIS — O403XX0 Polyhydramnios, third trimester, not applicable or unspecified: Secondary | ICD-10-CM

## 2022-12-11 DIAGNOSIS — O99214 Obesity complicating childbirth: Secondary | ICD-10-CM

## 2022-12-11 DIAGNOSIS — E669 Obesity, unspecified: Secondary | ICD-10-CM

## 2022-12-11 DIAGNOSIS — Z3A39 39 weeks gestation of pregnancy: Secondary | ICD-10-CM

## 2022-12-11 SURGERY — Surgical Case
Anesthesia: Epidural

## 2022-12-11 MED ORDER — SCOPOLAMINE 1 MG/3DAYS TD PT72
MEDICATED_PATCH | TRANSDERMAL | Status: AC
  Filled 2022-12-11: qty 1

## 2022-12-11 MED ORDER — SIMETHICONE 80 MG PO CHEW
80.0000 mg | CHEWABLE_TABLET | Freq: Three times a day (TID) | ORAL | Status: DC
  Administered 2022-12-12 – 2022-12-13 (×3): 80 mg via ORAL
  Filled 2022-12-11 (×3): qty 1

## 2022-12-11 MED ORDER — OXYTOCIN-SODIUM CHLORIDE 30-0.9 UT/500ML-% IV SOLN
INTRAVENOUS | Status: DC | PRN
  Administered 2022-12-11 (×2): 30 [IU] via INTRAVENOUS

## 2022-12-11 MED ORDER — DIPHENHYDRAMINE HCL 25 MG PO CAPS: 25.0000 mg | ORAL_CAPSULE | Freq: Four times a day (QID) | ORAL | Status: DC | PRN

## 2022-12-11 MED ORDER — MENTHOL 3 MG MT LOZG: 1.0000 | LOZENGE | OROMUCOSAL | Status: DC | PRN

## 2022-12-11 MED ORDER — LIDOCAINE-EPINEPHRINE (PF) 2 %-1:200000 IJ SOLN
INTRAMUSCULAR | Status: DC | PRN
  Administered 2022-12-11: 5 mL via EPIDURAL
  Administered 2022-12-11: 2 mL via EPIDURAL
  Administered 2022-12-11: 3 mL via EPIDURAL
  Administered 2022-12-11: 5 mL via EPIDURAL

## 2022-12-11 MED ORDER — COCONUT OIL OIL: 1.0000 | TOPICAL_OIL | Status: DC | PRN

## 2022-12-11 MED ORDER — METHYLERGONOVINE MALEATE 0.2 MG/ML IJ SOLN
INTRAMUSCULAR | Status: DC | PRN
  Administered 2022-12-11: .2 mg via INTRAMUSCULAR

## 2022-12-11 MED ORDER — ACETAMINOPHEN 500 MG PO TABS
1000.0000 mg | ORAL_TABLET | Freq: Four times a day (QID) | ORAL | Status: DC
  Administered 2022-12-12 – 2022-12-13 (×6): 1000 mg via ORAL
  Filled 2022-12-11 (×6): qty 2

## 2022-12-11 MED ORDER — NALOXONE HCL 0.4 MG/ML IJ SOLN: 0.4000 mg | INTRAMUSCULAR | Status: DC | PRN

## 2022-12-11 MED ORDER — FENTANYL CITRATE (PF) 100 MCG/2ML IJ SOLN
INTRAMUSCULAR | Status: DC | PRN
  Administered 2022-12-11: 100 ug via EPIDURAL

## 2022-12-11 MED ORDER — LEVOTHYROXINE SODIUM 25 MCG PO TABS
25.0000 ug | ORAL_TABLET | Freq: Every morning | ORAL | Status: DC
  Administered 2022-12-12 – 2022-12-13 (×2): 25 ug via ORAL
  Filled 2022-12-11 (×2): qty 1

## 2022-12-11 MED ORDER — NALOXONE HCL 4 MG/10ML IJ SOLN: 1.0000 ug/kg/h | INTRAVENOUS | Status: DC | PRN

## 2022-12-11 MED ORDER — SOD CITRATE-CITRIC ACID 500-334 MG/5ML PO SOLN
30.0000 mL | ORAL | Status: AC
  Administered 2022-12-11: 30 mL via ORAL

## 2022-12-11 MED ORDER — ENOXAPARIN SODIUM 60 MG/0.6ML IJ SOSY
60.0000 mg | PREFILLED_SYRINGE | INTRAMUSCULAR | Status: DC
  Administered 2022-12-12 – 2022-12-13 (×2): 60 mg via SUBCUTANEOUS
  Filled 2022-12-11 (×2): qty 0.6

## 2022-12-11 MED ORDER — IBUPROFEN 600 MG PO TABS
600.0000 mg | ORAL_TABLET | Freq: Four times a day (QID) | ORAL | Status: DC
  Administered 2022-12-12 – 2022-12-13 (×4): 600 mg via ORAL
  Filled 2022-12-11 (×4): qty 1

## 2022-12-11 MED ORDER — MORPHINE SULFATE (PF) 0.5 MG/ML IJ SOLN
INTRAMUSCULAR | Status: AC
  Filled 2022-12-11: qty 10

## 2022-12-11 MED ORDER — DIPHENHYDRAMINE HCL 50 MG/ML IJ SOLN: 12.5000 mg | INTRAMUSCULAR | Status: DC | PRN

## 2022-12-11 MED ORDER — HYDROMORPHONE HCL 1 MG/ML IJ SOLN: 0.2000 mg | INTRAMUSCULAR | Status: DC | PRN

## 2022-12-11 MED ORDER — TETANUS-DIPHTH-ACELL PERTUSSIS 5-2.5-18.5 LF-MCG/0.5 IM SUSY: 0.5000 mL | PREFILLED_SYRINGE | Freq: Once | INTRAMUSCULAR | Status: DC

## 2022-12-11 MED ORDER — SENNOSIDES-DOCUSATE SODIUM 8.6-50 MG PO TABS
2.0000 | ORAL_TABLET | ORAL | Status: DC
  Administered 2022-12-12 – 2022-12-13 (×2): 2 via ORAL
  Filled 2022-12-11 (×2): qty 2

## 2022-12-11 MED ORDER — PHENYLEPHRINE HCL-NACL 20-0.9 MG/250ML-% IV SOLN
INTRAVENOUS | Status: DC | PRN
  Administered 2022-12-11: 30 ug/min via INTRAVENOUS

## 2022-12-11 MED ORDER — SCOPOLAMINE 1 MG/3DAYS TD PT72
1.0000 | MEDICATED_PATCH | Freq: Once | TRANSDERMAL | Status: DC
  Administered 2022-12-11: 1.5 mg via TRANSDERMAL

## 2022-12-11 MED ORDER — MISOPROSTOL 200 MCG PO TABS
ORAL_TABLET | ORAL | Status: AC
  Filled 2022-12-11: qty 5

## 2022-12-11 MED ORDER — MEPERIDINE HCL 25 MG/ML IJ SOLN: 6.2500 mg | INTRAMUSCULAR | Status: DC | PRN

## 2022-12-11 MED ORDER — FENTANYL CITRATE (PF) 100 MCG/2ML IJ SOLN
INTRAMUSCULAR | Status: AC
  Filled 2022-12-11: qty 2

## 2022-12-11 MED ORDER — OXYCODONE HCL 5 MG PO TABS
5.0000 mg | ORAL_TABLET | ORAL | Status: DC | PRN
  Administered 2022-12-12: 10 mg via ORAL
  Administered 2022-12-13: 5 mg via ORAL
  Filled 2022-12-11: qty 1
  Filled 2022-12-11: qty 2

## 2022-12-11 MED ORDER — MISOPROSTOL 200 MCG PO TABS
1000.0000 ug | ORAL_TABLET | Freq: Once | ORAL | Status: AC
  Administered 2022-12-11: 1000 ug via RECTAL

## 2022-12-11 MED ORDER — ACETAMINOPHEN 500 MG PO TABS: 1000.0000 mg | ORAL_TABLET | Freq: Four times a day (QID) | ORAL | Status: DC

## 2022-12-11 MED ORDER — KETOROLAC TROMETHAMINE 30 MG/ML IJ SOLN: 30.0000 mg | Freq: Four times a day (QID) | INTRAMUSCULAR | Status: DC | PRN

## 2022-12-11 MED ORDER — SODIUM CHLORIDE 0.9 % IV SOLN
500.0000 mg | INTRAVENOUS | Status: AC
  Administered 2022-12-11: 500 mg via INTRAVENOUS

## 2022-12-11 MED ORDER — TRANEXAMIC ACID-NACL 1000-0.7 MG/100ML-% IV SOLN
INTRAVENOUS | Status: DC | PRN
  Administered 2022-12-11: 1000 mg via INTRAVENOUS

## 2022-12-11 MED ORDER — SIMETHICONE 80 MG PO CHEW: 80.0000 mg | CHEWABLE_TABLET | ORAL | Status: DC | PRN

## 2022-12-11 MED ORDER — LACTATED RINGERS IV SOLN: INTRAVENOUS | Status: DC

## 2022-12-11 MED ORDER — FENTANYL CITRATE (PF) 100 MCG/2ML IJ SOLN: 25.0000 ug | INTRAMUSCULAR | Status: DC | PRN

## 2022-12-11 MED ORDER — SODIUM CHLORIDE 0.9% FLUSH: 3.0000 mL | INTRAVENOUS | Status: DC | PRN

## 2022-12-11 MED ORDER — ONDANSETRON HCL 4 MG/2ML IJ SOLN
INTRAMUSCULAR | Status: DC | PRN
  Administered 2022-12-11: 4 mg via INTRAVENOUS

## 2022-12-11 MED ORDER — MORPHINE SULFATE (PF) 0.5 MG/ML IJ SOLN
INTRAMUSCULAR | Status: DC | PRN
  Administered 2022-12-11: 3 mg via EPIDURAL
  Administered 2022-12-11: 2 mg via EPIDURAL

## 2022-12-11 MED ORDER — PRENATAL MULTIVITAMIN CH
1.0000 | ORAL_TABLET | Freq: Every day | ORAL | Status: DC
  Administered 2022-12-12 – 2022-12-13 (×2): 1 via ORAL
  Filled 2022-12-11 (×2): qty 1

## 2022-12-11 MED ORDER — WITCH HAZEL-GLYCERIN EX PADS: 1.0000 | MEDICATED_PAD | CUTANEOUS | Status: DC | PRN

## 2022-12-11 MED ORDER — ONDANSETRON HCL 4 MG/2ML IJ SOLN: 4.0000 mg | Freq: Three times a day (TID) | INTRAMUSCULAR | Status: DC | PRN

## 2022-12-11 MED ORDER — DEXAMETHASONE SODIUM PHOSPHATE 10 MG/ML IJ SOLN
INTRAMUSCULAR | Status: DC | PRN
  Administered 2022-12-11: 10 mg via INTRAVENOUS

## 2022-12-11 MED ORDER — KETOROLAC TROMETHAMINE 30 MG/ML IJ SOLN
30.0000 mg | Freq: Four times a day (QID) | INTRAMUSCULAR | Status: AC
  Administered 2022-12-12 (×3): 30 mg via INTRAVENOUS
  Filled 2022-12-11 (×3): qty 1

## 2022-12-11 MED ORDER — MORPHINE SULFATE (PF) 0.5 MG/ML IJ SOLN
INTRAMUSCULAR | Status: DC | PRN
  Administered 2022-12-11: 2 mg via INTRAVENOUS

## 2022-12-11 MED ORDER — PROMETHAZINE HCL 25 MG/ML IJ SOLN: 6.2500 mg | INTRAMUSCULAR | Status: DC | PRN

## 2022-12-11 MED ORDER — CEFAZOLIN IN SODIUM CHLORIDE 3-0.9 GM/100ML-% IV SOLN
3.0000 g | INTRAVENOUS | Status: AC
  Administered 2022-12-11: 3 g via INTRAVENOUS
  Filled 2022-12-11: qty 100

## 2022-12-11 MED ORDER — OXYTOCIN-SODIUM CHLORIDE 30-0.9 UT/500ML-% IV SOLN
2.5000 [IU]/h | INTRAVENOUS | Status: AC
  Administered 2022-12-11: 2.5 [IU]/h via INTRAVENOUS
  Filled 2022-12-11: qty 500

## 2022-12-11 MED ORDER — DIBUCAINE (PERIANAL) 1 % EX OINT: 1.0000 | TOPICAL_OINTMENT | CUTANEOUS | Status: DC | PRN

## 2022-12-11 MED ORDER — DIPHENHYDRAMINE HCL 25 MG PO CAPS: 25.0000 mg | ORAL_CAPSULE | ORAL | Status: DC | PRN

## 2022-12-11 SURGICAL SUPPLY — 34 items
BENZOIN TINCTURE PRP APPL 2/3 (GAUZE/BANDAGES/DRESSINGS) ×1 IMPLANT
CHLORAPREP W/TINT 26 (MISCELLANEOUS) ×2 IMPLANT
CLAMP UMBILICAL CORD (MISCELLANEOUS) ×1 IMPLANT
CLOTH BEACON ORANGE TIMEOUT ST (SAFETY) ×1 IMPLANT
DRSG OPSITE POSTOP 4X10 (GAUZE/BANDAGES/DRESSINGS) ×1 IMPLANT
ELECT REM PT RETURN 9FT ADLT (ELECTROSURGICAL) ×1
ELECTRODE REM PT RTRN 9FT ADLT (ELECTROSURGICAL) ×1 IMPLANT
EXTRACTOR VACUUM BELL CUP MITY (SUCTIONS) IMPLANT
GAUZE SPONGE 4X4 12PLY STRL LF (GAUZE/BANDAGES/DRESSINGS) IMPLANT
GLOVE BIO SURGEON STRL SZ 6 (GLOVE) ×1 IMPLANT
GLOVE BIOGEL PI IND STRL 6.5 (GLOVE) ×1 IMPLANT
GOWN STRL REUS W/TWL LRG LVL3 (GOWN DISPOSABLE) ×2 IMPLANT
KIT ABG SYR 3ML LUER SLIP (SYRINGE) ×1 IMPLANT
MAT PREVALON FULL STRYKER (MISCELLANEOUS) IMPLANT
NDL HYPO 25X5/8 SAFETYGLIDE (NEEDLE) ×1 IMPLANT
NEEDLE HYPO 25X5/8 SAFETYGLIDE (NEEDLE) ×1 IMPLANT
NS IRRIG 1000ML POUR BTL (IV SOLUTION) ×1 IMPLANT
PACK C SECTION WH (CUSTOM PROCEDURE TRAY) ×1 IMPLANT
PAD ABD 7.5X8 STRL (GAUZE/BANDAGES/DRESSINGS) IMPLANT
PAD OB MATERNITY 4.3X12.25 (PERSONAL CARE ITEMS) ×1 IMPLANT
PENCIL SMOKE EVAC W/HOLSTER (ELECTROSURGICAL) IMPLANT
RETAINER VISCERAL (MISCELLANEOUS) IMPLANT
RTRCTR C-SECT PINK 25CM LRG (MISCELLANEOUS) ×1 IMPLANT
STRIP CLOSURE SKIN 1/2X4 (GAUZE/BANDAGES/DRESSINGS) ×1 IMPLANT
SUT MNCRL 0 VIOLET CTX 36 (SUTURE) ×2 IMPLANT
SUT MONOCRYL 0 CTX 36 (SUTURE) ×2
SUT VIC AB 0 CT1 36 (SUTURE) ×2 IMPLANT
SUT VIC AB 2-0 CT1 (SUTURE) IMPLANT
SUT VIC AB 3-0 CT1 27 (SUTURE) ×2
SUT VIC AB 3-0 CT1 TAPERPNT 27 (SUTURE) ×1 IMPLANT
SUT VIC AB 4-0 KS 27 (SUTURE) ×1 IMPLANT
TOWEL OR 17X24 6PK STRL BLUE (TOWEL DISPOSABLE) ×1 IMPLANT
TRAY FOLEY W/BAG SLVR 14FR LF (SET/KITS/TRAYS/PACK) ×1 IMPLANT
WATER STERILE IRR 1000ML POUR (IV SOLUTION) ×1 IMPLANT

## 2022-12-11 NOTE — Anesthesia Postprocedure Evaluation (Signed)
Anesthesia Post Note  Patient: Jaclyn Reynolds  Procedure(s) Performed: CESAREAN SECTION     Patient location during evaluation: PACU Anesthesia Type: Epidural Level of consciousness: awake, awake and alert and oriented Pain management: pain level controlled Vital Signs Assessment: post-procedure vital signs reviewed and stable Respiratory status: spontaneous breathing, nonlabored ventilation and respiratory function stable Cardiovascular status: blood pressure returned to baseline and stable Postop Assessment: no headache, no backache, no apparent nausea or vomiting and epidural receding Anesthetic complications: no   No notable events documented.  Last Vitals:  Vitals:   12/11/22 1645 12/11/22 1709  BP: 124/82 (!) 138/91  Pulse: 89 90  Resp: 15   Temp:  37.1 C  SpO2: 95% 97%    Last Pain:  Vitals:   12/11/22 1645  TempSrc:   PainSc: 3    Pain Goal:                   Collene Schlichter

## 2022-12-11 NOTE — Progress Notes (Signed)
OB Progress Note  S: feeling some rectal pressure and upper abdominal cramping with contractions   O: Today's Vitals   12/11/22 0702 12/11/22 0733 12/11/22 0831 12/11/22 0902  BP: (!) 98/52 (!) 87/33 (!) 105/56 (!) 115/47  Pulse: 91 65 86 93  Resp: 18 17    Temp:  98.1 F (36.7 C)    TempSrc:  Axillary    SpO2:      Weight:      Height:      PainSc:  0-No pain     Body mass index is 47.58 kg/m.  SVE 10/100/ 0 to +1  FHR: 130bpm, mod variability, + accels, no decels Toco: ctx q 2-4 mins, IUPC in place, MVUs 60-90   A/P: 26Y G1P0 @ [redacted]w[redacted]d, IOL polyhydramnios - Fetal wellbeing: cat I tracing - Labor: fully dilated now, recommend to start pushing. Patient is very anxious about pushing, requested 30 mins to mentally prepare, then will start pushing. Titrate pitocin per protocol. - Pain control: epidural   M. Timothy Lasso, MD 12/11/22 9:29 AM

## 2022-12-11 NOTE — Progress Notes (Signed)
OB Progress Note  No descent with 3 hours of pushing. Fetal status still reassuring. Would like to proceed with cesarean for arrest of descent. OR notified.   Consent reviewed earlier and signed now. Ancef 3g + Azithromycin 500mg   , MD 12/11/22 2:10 PM

## 2022-12-11 NOTE — Op Note (Signed)
CESAREAN SECTION Procedure Note  Patient: Jaclyn Reynolds is a 26 y.o. G1P1001 @ [redacted]w[redacted]d  Preoperative Diagnosis:  Intrauterine pregnancy at 39 weeks 2 days Arrest of descent Polyhydramnios Obesity  Postoperative Diagnosis: same, delivered  Procedure: Primary low transverse cesarean     Surgeon: Charlett Nose , MD  Assistant: Sheppard Evens, MD An experienced assistant was required given the standard of surgical care given the complexity of the case.  This assistant was needed for exposure, dissection, suctioning, retraction, instrument exchange, assisting with delivery with administration of fundal pressure, and for overall help during the procedure.  Anesthesia: Epidural anesthesia   Findings: Normal appearing uterus, fallopian tubes bilaterally, and ovaries bilaterally.  Viable female infant in vertex right occiput transverse presentation delivered at 1453 with weight 4080g (8 pounds 15.9ounces) Apgars 9 and 9.  Estimated Blood Loss:          Specimens: Placenta to L&D for disposal         Complications:  None         Disposition: PACU - hemodynamically stable.         Condition: stable    Description of Procedure: The patient was taken to the operating room where epidural anesthesia was rebolused and found to be adequate.  The patient was placed in the dorsal supine position.  Fetal heart tones were confirmed. Thromboguards were applied and cycling. A foley catheter was inserted and draining. Ancef 3g and azithromycin 500mg  were given for infection prophylaxis. The patient was subsequently prepped and draped in the normal sterile fashion.    A low transverse skin incision was made with a scalpel and carried down to the level of the fascia with the Bovie.  The fascia was incised in the midline with the scalpel and extended laterally with curved Mayo scissors.  Kocher clamps were applied to the inferior fascial edge and the fascia was dissected off the rectus muscle  sharply using the Mayo scissors.  The Kocher clamps were transferred to the superior fascial edge and the underlying rectus muscle was dissected off with curved Mayo's scissors.  The rectus muscles then were separated in the midline.  The peritoneum was found free of adherent bowel and the peritoneal cavity was entered with Metzenbaum scissors.  The uterus was identified and the alexis retractor was placed intraperitoneal.  A bladder flap was then created sharply with Metzenbaum scissors and separated from the lower uterine segment digitally.   A low transverse hysterotomy was then made with a scalpel.  The infant was found in the vertex presentation was delivered atraumatically and without difficulty with standard maneuvers.  After 60 seconds of delayed cord clamping the cord was clamped and cut and the infant was handed off to the pediatricians.  The placenta was delivered with gentle traction on umbilical cord and manual massage of the uterine fundus.  The uterus was cleared of all clot and debris.  The hysterotomy was then closed with 0 monocryl in a running locked fashion,  followed by 0 Monocryl in an imbricating fashion.  The hysterotomy was found to be hemostatic. Poor uterine tone was note, tranexamic acid 1g IV, methergine 0.2mg  IM, and cytotec PR were given with good response. The peritoneum and muscle were reapproximated with 2-0 vicryl in a running fashion. The fascia was closed with a 0 Vicryl suture in a continuous running fashion.  The subcutaneous tissue was irrigated and rendered hemostatic with cautery.  The subcutaneous layer was subsequently closed with 3-0 Vicryl in  a continuous running fashion.  The skin was closed with 4-0 vicryl  in a running subcuticular fashion.  Sponge, lap and needle counts were correct. Steri strips and a Honeycomb dressing were placed on the incision.  Charlett Nose 12/11/22  4:31 PM

## 2022-12-11 NOTE — Transfer of Care (Signed)
Immediate Anesthesia Transfer of Care Note  Patient: Jaclyn Reynolds  Procedure(s) Performed: CESAREAN SECTION  Patient Location: PACU  Anesthesia Type:Epidural  Level of Consciousness: awake, alert  and patient cooperative  Airway & Oxygen Therapy: Patient Spontanous Breathing  Post-op Assessment: Report given to RN and Post -op Vital signs reviewed and stable  Post vital signs: Reviewed and stable  Last Vitals:  Vitals Value Taken Time  BP 109/57 12/11/22 1553  Temp 36.4 C 12/11/22 1553  Pulse 99 12/11/22 1557  Resp 16 12/11/22 1557  SpO2 98 % 12/11/22 1557  Vitals shown include unvalidated device data.  Last Pain:  Vitals:   12/11/22 1553  TempSrc:   PainSc: 0-No pain         Complications: No notable events documented.

## 2022-12-11 NOTE — Progress Notes (Signed)
Patient ID: Jaclyn Reynolds, female   DOB: [birth year], 26 y.o.   MRN: [id number] Pt comfortable with epidural. +Fms VSS EFM - cat 1, 145 TOCO - ctxs q  Pit at ; IUPC in place MVUs 90 SVE - 7/90/-2  A/P: Progressing well on pitocin in labor, AROM - stable          Continue with expectant mgmt

## 2022-12-11 NOTE — Progress Notes (Signed)
OB Progress Note  Patient has been pushing on and off since about 1000 with good effort. She has taken a couple breaks. Station is still 0 to +1 despite good effort with pushing.   Fetal heart tracing has remained reassuring.  Discussed with patient - baby tolerating pushing well so okay to continue. Has been pushing for a little over 2 hours and hasn't made any progress. She is getting tired and frustrated. I am highly suspicious of cephalopelvic disproportion. We discussed continuing to  push vs. proceeding with primary cesarean. She would like to push until 1400 (so total 3 hours pushing) and if hasn't made any progress at that point would proceed with cesarean.  We discussed the cesarean procedure and risks in detail, including risk of infection, bleeding, blood transfusion, hysterectomy, damage to surrounding organs, fetal laceration. I answered all her questions about the procedure.   Will recheck at 1400. OR aware.  Alinda Deem, MD 12/11/22 1:33 PM

## 2022-12-11 NOTE — Lactation Note (Signed)
This note was copied from a baby's chart. Lactation Consultation Note  Patient Name: Jaclyn Reynolds [id number] Date: 12/11/2022 Reason for consult: Initial assessment;Primapara;Term;Maternal endocrine disorder;Breast reduction Age:27 hours Assisted baby to breast after changing diaper.  Mom had breast reduction and nipples were removed. Hand expression after breast massage for several minutes saw a glistening of colostrum. Mom happy to see colostrum because she didn't know if she would be able to milk or not since she had a breast reduction. Discussed w/mom pumping for extra stimulation. Mom stated she would like to pump. LC Mom shown how to use DEBP & how to disassemble, clean, & reassemble parts. Mom needs smaller that #21 flange. Mom doesn't want to pump until am.  Mom stated when the baby latched earlier it hurt but it isn't hurting now. When the baby came off from BF 30 minutes noted sm. Blood blister to Rt. Nipple.  Mom denied pain to nipple and stated it didn't hurt. Discussed positioning and support. Encouraged to call for assistance when needed. No swallows noted. Mom is OK w/supplementing if needed.  Maternal Data Has patient been taught Hand Expression?: Yes Does the patient have breastfeeding experience prior to this delivery?: No  Feeding    LATCH Score Latch: Grasps breast easily, tongue down, lips flanged, rhythmical sucking.  Audible Swallowing: None  Type of Nipple: Flat  Comfort (Breast/Nipple): Filling, red/small blisters or bruises, mild/mod discomfort (sm. blood blister to Rt. nipple)  Hold (Positioning): Assistance needed to correctly position infant at breast and maintain latch.  LATCH Score: 5   Lactation Tools Discussed/Used Tools: Shells;Pump;Flanges Flange Size: 21 Breast pump type: Double-Electric Breast Pump Pump Education: Setup, frequency, and cleaning;Milk Storage Reason for Pumping: breast reduction Pumping frequency: q3hr (mom doesn't want to  pump until am.)  Interventions Interventions: Breast feeding basics reviewed;Adjust position;DEBP;Assisted with latch;Support pillows;Skin to skin;Position options;Breast massage;Education;Hand express;LC Psychologist, educational;Shells;Pre-pump if needed;Breast compression  Discharge    Consult Status Consult Status: Follow-up Date: 12/12/22 Follow-up type: In-patient    Charyl Dancer 12/11/2022, 10:30 PM

## 2022-12-12 ENCOUNTER — Encounter (HOSPITAL_COMMUNITY): Payer: Self-pay | Admitting: Obstetrics and Gynecology

## 2022-12-12 LAB — CBC
HCT: 28.5 % — ABNORMAL LOW (ref 36.0–46.0)
Hemoglobin: 9.3 g/dL — ABNORMAL LOW (ref 12.0–15.0)
MCH: 28.1 pg (ref 26.0–34.0)
MCHC: 32.6 g/dL (ref 30.0–36.0)
MCV: 86.1 fL (ref 80.0–100.0)
Platelets: 210 10*3/uL (ref 150–400)
RBC: 3.31 MIL/uL — ABNORMAL LOW (ref 3.87–5.11)
RDW: 13.7 % (ref 11.5–15.5)
WBC: 14.9 10*3/uL — ABNORMAL HIGH (ref 4.0–10.5)
nRBC: 0 % (ref 0.0–0.2)

## 2022-12-12 MED ORDER — FERROUS SULFATE 325 (65 FE) MG PO TABS
325.0000 mg | ORAL_TABLET | Freq: Every day | ORAL | Status: DC
  Administered 2022-12-13: 325 mg via ORAL
  Filled 2022-12-12: qty 1

## 2022-12-12 NOTE — Lactation Note (Signed)
This note was copied from a baby's chart. Lactation Consultation Note  Patient Name: Girl Quanika Solem [id number] Date: 12/12/2022  Reason for consult: Breast reduction ( per mom 2016 ) + breast changes and per mom thinks the nipple / areola was removed  - P1, 1st time BF, Hypothyroidism .  Age:27 hours 1st LC visit was at 0915 - attempt to latch. LC assisted the entire time on the right breast / for 8 mins, it was on / and off, would sustain for a few minutes and release. LC suspects since the baby has stooled x 3 ready for a bugger meal and the flow isn't there and baby gets disorganized and off the breast. LC assisted for the left breast cross cradle and the baby sustained the latch longer with few swallows and then off , LC added the #24 NS and the baby sustained longer.  LC discussed parents the importance of having the baby calm to latch, and with being that the baby is hungry and not settling down, LC recommended supplementing. LC offered donor milk by consent and mom requested formula.  LC baby calm , dad holding baby and encouraged mom to call with feeding cues.   Follow-up assessment (Parents called, baby hungry, and LC was planning on starting a 5 F STS at the [facility], and per mom requested baby to be fed a Bo since she wanted the baby to be able to visit w/ her visitors. LC showed parents how  to pace feed. LC enc mom to call for Latch.) Baby tolerated well and much calmer have feeding.  Maternal Data Has patient been taught Hand Expression?: Yes Does the patient have breastfeeding experience prior to this delivery?: No  Feeding Mother's Current Feeding Choice: Breast Milk and Formula Nipple Type: Extra Slow Flow  LATCH Score - ( 1st consult [phone] )  Latch: Repeated attempts needed to sustain latch, nipple held in mouth throughout feeding, stimulation needed to elicit sucking reflex.  Audible Swallowing: None  Type of Nipple: Flat (areola compressible)  Comfort (Breast/Nipple): Soft /  non-tender  Hold (Positioning): Assistance needed to correctly position infant at breast and maintain latch.  LATCH Score: 5   Lactation Tools Discussed/Used Tools: Nipple Shields Nipple shield size: 24;Other (comment) (LC started the NS - to enhance the baby sustaining the latch instead of On and off latch) Breast pump type: Double-Electric Breast Pump;Manual Pump Education: Milk Storage (DEBP had been set up by previous LC)  Interventions Interventions: Pace feeding  Discharge    Consult Status Consult Status: Follow-up Date: 12/12/22 Follow-up type: In-patient    Matilde Sprang Dakiyah Heinke 12/12/2022, 10:45 AM

## 2022-12-12 NOTE — Progress Notes (Signed)
POSTPARTUM POSTOP PROGRESS NOTE  POD #1  Subjective:  No acute events overnight.  Pt denies problems with ambulating, voiding or po intake.  She denies nausea or vomiting.  Pain is well controlled.  She has had flatus. She has not had bowel movement.  Lochia Minimal.   Objective: Blood pressure 110/65, pulse 73, temperature 97.8 F (36.6 C), temperature source Oral, resp. rate 18, height 5\' 3"  (1.6 m), weight 121.8 kg, last menstrual period 10/19/2021, SpO2 99 %, unknown if currently breastfeeding.  Physical Exam:  General: alert, cooperative and no distress Lochia:normal flow Chest: CTAB Heart: RRR no m/r/g Abdomen: +BS, soft, nontender Uterine Fundus: firm, 2cm below umbilicus. Honeycomb dressing intact, negdrainage Extremities: neg edema, neg calf TTP BL, neg Homans BL  Recent Labs    12/10/22 0026 12/12/22 0538  HGB 11.7* 9.3*  HCT 35.3* 28.5*    Assessment/Plan:  ASSESSMENT: Ryenn Howeth is a 26 y.o. G1P1001 s/p PLTCS @ [redacted]w[redacted]d for arrest of descent. PNC c/b anxiety (PRN xanax), hypothyroidism on Synthroid.   Breastfeeding, Lactation consult, and Social Work consult Hgb 9.3 - PO iron ordered Continue Synthroid [redacted]w[redacted]d   LOS: 3 days

## 2022-12-13 MED ORDER — IBUPROFEN 800 MG PO TABS: 800.0000 mg | ORAL_TABLET | Freq: Three times a day (TID) | ORAL | 1 refills | Status: AC | PRN

## 2022-12-13 NOTE — Discharge Summary (Signed)
Postpartum Discharge Summary  Date of Service updated     Patient Name: Jaclyn Reynolds DOB: 1996/05/19 MRN: [id number]  Date of admission: 12/09/2022 Delivery date:12/11/2022  Delivering provider: Irene Pap E  Date of discharge: 12/13/2022  Admitting diagnosis: Polyhydramnios affecting pregnancy in third trimester [O40.3XX0] Intrauterine pregnancy: [redacted]w[redacted]d     Secondary diagnosis:  Principal Problem:   Polyhydramnios affecting pregnancy in third trimester  Additional problems: arrest of descent, anxiety, hypothyroidism    Discharge diagnosis: Term Pregnancy Delivered                                              Post partum procedures: none Augmentation: AROM and Pitocin Complications: None  Hospital course: Induction of Labor With Cesarean Section   26 y.o. yo G1P1001 at [redacted]w[redacted]d was admitted to the hospital 12/09/2022 for induction of labor. Patient had a labor course significant for IOL for polyhydramnios. The patient went for cesarean section due to Arrest of Descent. Delivery details are as follows: Membrane Rupture Time/Date: 4:20 PM ,12/10/2022   Delivery Method:C-Section, Low Transverse  Details of operation can be found in separate operative Note.  Patient had a postpartum course complicated bymild anemia. She is ambulating, tolerating a regular diet, passing flatus, and urinating well.  Patient is discharged home in stable condition on 12/13/22.      Newborn Data: Birth date:12/11/2022  Birth time:2:53 PM  Gender:Female  Living status:Living  Apgars:9 ,9  Weight:3853 g                                  Physical exam  Vitals:   12/12/22 0840 12/12/22 1406 12/12/22 2040 12/13/22 0500  BP: 110/65 (!) 102/59 105/67 126/73  Pulse: 73 64 78 72  Resp: 18 16 16 18   Temp: 97.8 F (36.6 C) 97.8 F (36.6 C) 98.1 F (36.7 C) 98 F (36.7 C)  TempSrc: Oral Oral Oral Oral  SpO2: 99% 99% 98% 99%  Weight:      Height:       General: alert, cooperative, and no  distress Lochia: appropriate Uterine Fundus: firm Incision: Healing well with no significant drainage, No significant erythema, Dressing is clean, dry, and intact DVT Evaluation: No evidence of DVT seen on physical exam. Negative Homan's sign. No cords or calf tenderness. Labs: Lab Results  Component Value Date   WBC 14.9 (H) 12/12/2022   HGB 9.3 (L) 12/12/2022   HCT 28.5 (L) 12/12/2022   MCV 86.1 12/12/2022   PLT 210 12/12/2022      Latest Ref Rng & Units 08/28/2012   12:11 PM  CMP  Glucose 70 - 99 mg/dL 113   BUN 6 - 23 mg/dL 10   Creatinine 0.47 - 1.00 mg/dL 0.80   Sodium 135 - 145 mEq/L 141   Potassium 3.5 - 5.1 mEq/L 3.8   Chloride 96 - 112 mEq/L 108    Edinburgh Score:    12/12/2022    1:26 PM  Edinburgh Postnatal Depression Scale Screening Tool  I have been able to laugh and see the funny side of things. 0  I have looked forward with enjoyment to things. 0  I have blamed myself unnecessarily when things went wrong. 2  I have been anxious or worried for no good reason. 1  I have  felt scared or panicky for no good reason. 0  Things have been getting on top of me. 0  I have been so unhappy that I have had difficulty sleeping. 0  I have felt sad or miserable. 0  I have been so unhappy that I have been crying. 0  The thought of harming myself has occurred to me. 0  Edinburgh Postnatal Depression Scale Total 3      After visit meds:  Allergies as of 12/13/2022       Reactions   Doxycycline Rash        Medication List     STOP taking these medications    amoxicillin 875 MG tablet Commonly known as: AMOXIL   Blisovi 24 Fe 1-20 MG-MCG(24) tablet Generic drug: Norethindrone Acetate-Ethinyl Estrad-FE       TAKE these medications    ALPRAZolam 0.5 MG tablet Commonly known as: Xanax Take 1 tablet (0.5 mg total) by mouth daily as directed for 1 day.   cetirizine 10 MG tablet Commonly known as: ZYRTEC Take 1 tablet (10 mg total) by mouth daily.    ibuprofen 800 MG tablet Commonly known as: ADVIL Take 1 tablet (800 mg total) by mouth every 8 (eight) hours as needed.   levothyroxine 25 MCG tablet Commonly known as: SYNTHROID Take 25 mcg by mouth every morning.   levothyroxine 25 MCG tablet Commonly known as: SYNTHROID Take 1 tablet by mouth in the morning on an empty stomach daily   levothyroxine 25 MCG tablet Commonly known as: SYNTHROID Take 1 tablet in the morning on an empty stomach daily   montelukast 10 MG tablet Commonly known as: SINGULAIR Take 1 tablet (10 mg total) by mouth daily.   Xhance 93 MCG/ACT Exhu Generic drug: Fluticasone Propionate Place 2 sprays into the nose in the morning and at bedtime.         Discharge home in stable condition Infant Feeding: Breast Infant Disposition:home with mother Discharge instruction: per After Visit Summary and Postpartum booklet. Activity: Advance as tolerated. Pelvic rest for 6 weeks.  Diet: routine diet Anticipated Birth Control: Unsure Postpartum Appointment:6 weeks Additional Postpartum F/U: Incision check 2wk Follow up Visit:[facility]    12/13/2022 Carlisle Cater, MD

## 2022-12-13 NOTE — Progress Notes (Signed)
POSTPARTUM POSTOP PROGRESS NOTE  POD #2  Subjective:  No acute events overnight.  Pt denies problems with ambulating, voiding or po intake.  She denies nausea or vomiting.  Pain is well controlled.  She has had flatus. She has had bowel movement.  Lochia Minimal.   Objective: Blood pressure 126/73, pulse 72, temperature 98 F (36.7 C), temperature source Oral, resp. rate 18, height 5\' 3"  (1.6 m), weight 121.8 kg, last menstrual period 10/19/2021, SpO2 99 %, unknown if currently breastfeeding.  Physical Exam:  General: alert, cooperative and no distress Lochia:normal flow Chest: CTAB Heart: RRR no m/r/g Abdomen: +BS, soft, nontender Uterine Fundus: firm, 2cm below umbilicus. Honeycomb dressing intact, negdrainage Extremities: neg edema, neg calf TTP BL, neg Homans BL  Recent Labs    12/12/22 0538  HGB 9.3*  HCT 28.5*     Assessment/Plan:  ASSESSMENT: Jaclyn Reynolds is a 26 y.o. G1P1001 s/p PLTCS @ [redacted]w[redacted]d for arrest of descent. PNC c/b anxiety (PRN xanax), hypothyroidism on Synthroid.   Breastfeeding, Lactation consult, and Social Work consult Hgb 9.3 - PO iron  Continue Synthroid [redacted]w[redacted]d Discharge home today   LOS: 4 days

## 2022-12-19 ENCOUNTER — Telehealth (HOSPITAL_COMMUNITY): Payer: Self-pay | Admitting: *Deleted

## 2022-12-19 NOTE — Telephone Encounter (Signed)
Attempted hospital discharge follow-up call. Left message for patient to return RN call with any questions or concerns. Erline Levine, RN, 12/19/22, [phone]

## 2023-01-26 ENCOUNTER — Other Ambulatory Visit (HOSPITAL_COMMUNITY): Payer: Self-pay

## 2023-01-26 DIAGNOSIS — Z1389 Encounter for screening for other disorder: Secondary | ICD-10-CM | POA: Diagnosis not present

## 2023-01-26 DIAGNOSIS — Z3009 Encounter for other general counseling and advice on contraception: Secondary | ICD-10-CM | POA: Diagnosis not present

## 2023-01-26 DIAGNOSIS — Z3041 Encounter for surveillance of contraceptive pills: Secondary | ICD-10-CM | POA: Diagnosis not present

## 2023-01-26 MED ORDER — HAILEY 24 FE 1-20 MG-MCG(24) PO TABS
1.0000 | ORAL_TABLET | Freq: Every day | ORAL | 4 refills | Status: DC
  Filled 2023-01-26: qty 84, 84d supply, fill #0
  Filled 2023-04-29: qty 84, 84d supply, fill #1
  Filled 2023-07-18: qty 84, 84d supply, fill #2
  Filled 2023-10-19: qty 84, 84d supply, fill #3
  Filled 2023-12-22 – 2024-01-12 (×2): qty 84, 84d supply, fill #4

## 2023-01-29 ENCOUNTER — Other Ambulatory Visit (HOSPITAL_COMMUNITY): Payer: Self-pay

## 2023-02-24 DIAGNOSIS — J029 Acute pharyngitis, unspecified: Secondary | ICD-10-CM | POA: Diagnosis not present

## 2023-02-24 DIAGNOSIS — R509 Fever, unspecified: Secondary | ICD-10-CM | POA: Diagnosis not present

## 2023-02-24 DIAGNOSIS — R051 Acute cough: Secondary | ICD-10-CM | POA: Diagnosis not present

## 2023-02-24 DIAGNOSIS — Z03818 Encounter for observation for suspected exposure to other biological agents ruled out: Secondary | ICD-10-CM | POA: Diagnosis not present

## 2023-03-01 ENCOUNTER — Other Ambulatory Visit (HOSPITAL_COMMUNITY): Payer: Self-pay

## 2023-03-01 MED ORDER — AMOXICILLIN-POT CLAVULANATE 875-125 MG PO TABS
875.0000 mg | ORAL_TABLET | Freq: Two times a day (BID) | ORAL | 0 refills | Status: AC
  Filled 2023-03-01: qty 14, 7d supply, fill #0

## 2023-03-06 ENCOUNTER — Other Ambulatory Visit (HOSPITAL_COMMUNITY): Payer: Self-pay

## 2023-04-29 ENCOUNTER — Other Ambulatory Visit (HOSPITAL_COMMUNITY): Payer: Self-pay

## 2023-07-18 ENCOUNTER — Other Ambulatory Visit (HOSPITAL_COMMUNITY): Payer: Self-pay

## 2023-07-19 ENCOUNTER — Other Ambulatory Visit (HOSPITAL_COMMUNITY): Payer: Self-pay

## 2023-07-19 ENCOUNTER — Other Ambulatory Visit: Payer: Self-pay

## 2023-07-19 MED ORDER — LEVOTHYROXINE SODIUM 25 MCG PO TABS
25.0000 ug | ORAL_TABLET | Freq: Every morning | ORAL | 0 refills | Status: DC
  Filled 2023-07-19: qty 30, 30d supply, fill #0

## 2023-08-05 ENCOUNTER — Other Ambulatory Visit (HOSPITAL_COMMUNITY): Payer: Self-pay

## 2023-08-05 DIAGNOSIS — Z0000 Encounter for general adult medical examination without abnormal findings: Secondary | ICD-10-CM | POA: Diagnosis not present

## 2023-08-05 DIAGNOSIS — E785 Hyperlipidemia, unspecified: Secondary | ICD-10-CM | POA: Diagnosis not present

## 2023-08-05 DIAGNOSIS — D649 Anemia, unspecified: Secondary | ICD-10-CM | POA: Diagnosis not present

## 2023-08-05 DIAGNOSIS — T148XXA Other injury of unspecified body region, initial encounter: Secondary | ICD-10-CM | POA: Diagnosis not present

## 2023-08-05 DIAGNOSIS — L858 Other specified epidermal thickening: Secondary | ICD-10-CM | POA: Diagnosis not present

## 2023-08-05 DIAGNOSIS — E039 Hypothyroidism, unspecified: Secondary | ICD-10-CM | POA: Diagnosis not present

## 2023-08-05 DIAGNOSIS — J309 Allergic rhinitis, unspecified: Secondary | ICD-10-CM | POA: Diagnosis not present

## 2023-08-05 MED ORDER — PHENTERMINE HCL 37.5 MG PO TABS
37.5000 mg | ORAL_TABLET | Freq: Every morning | ORAL | 0 refills | Status: DC
  Filled 2023-08-05: qty 30, 30d supply, fill #0

## 2023-08-05 MED ORDER — MONTELUKAST SODIUM 10 MG PO TABS
10.0000 mg | ORAL_TABLET | Freq: Every day | ORAL | 2 refills | Status: AC
  Filled 2023-08-05: qty 30, 30d supply, fill #0
  Filled 2023-09-13: qty 30, 30d supply, fill #1
  Filled 2023-10-19: qty 30, 30d supply, fill #2

## 2023-08-05 MED ORDER — LEVOCETIRIZINE DIHYDROCHLORIDE 5 MG PO TABS
5.0000 mg | ORAL_TABLET | Freq: Every day | ORAL | 2 refills | Status: AC
  Filled 2023-08-05: qty 30, 30d supply, fill #0
  Filled 2023-10-19 – 2024-01-12 (×3): qty 30, 30d supply, fill #1

## 2023-08-06 ENCOUNTER — Other Ambulatory Visit (HOSPITAL_COMMUNITY): Payer: Self-pay

## 2023-08-09 ENCOUNTER — Other Ambulatory Visit (HOSPITAL_COMMUNITY): Payer: Self-pay

## 2023-08-09 DIAGNOSIS — J3089 Other allergic rhinitis: Secondary | ICD-10-CM | POA: Diagnosis not present

## 2023-08-09 DIAGNOSIS — H1045 Other chronic allergic conjunctivitis: Secondary | ICD-10-CM | POA: Diagnosis not present

## 2023-08-09 DIAGNOSIS — J3081 Allergic rhinitis due to animal (cat) (dog) hair and dander: Secondary | ICD-10-CM | POA: Diagnosis not present

## 2023-08-09 MED ORDER — FLUTICASONE PROPIONATE 50 MCG/ACT NA SUSP
1.0000 | Freq: Every day | NASAL | 3 refills | Status: AC
  Filled 2023-08-09: qty 16, 60d supply, fill #0
  Filled 2023-11-17 – 2024-01-12 (×2): qty 16, 30d supply, fill #0

## 2023-08-09 MED ORDER — LEVOCETIRIZINE DIHYDROCHLORIDE 5 MG PO TABS
5.0000 mg | ORAL_TABLET | Freq: Every day | ORAL | 3 refills | Status: AC
  Filled 2023-10-19 – 2023-11-17 (×2): qty 30, 30d supply, fill #0

## 2023-08-09 MED ORDER — AZELASTINE HCL 0.05 % OP SOLN
1.0000 [drp] | Freq: Two times a day (BID) | OPHTHALMIC | 3 refills | Status: AC
  Filled 2023-08-09: qty 6, 30d supply, fill #0
  Filled 2023-11-17: qty 6, 28d supply, fill #0

## 2023-08-09 MED ORDER — MONTELUKAST SODIUM 10 MG PO TABS
10.0000 mg | ORAL_TABLET | Freq: Every day | ORAL | 3 refills | Status: AC
  Filled 2023-12-22: qty 30, 30d supply, fill #0
  Filled 2024-02-11: qty 30, 30d supply, fill #1
  Filled 2024-06-22: qty 30, 30d supply, fill #2

## 2023-08-09 MED ORDER — CETIRIZINE HCL 10 MG PO TABS
10.0000 mg | ORAL_TABLET | Freq: Every day | ORAL | 3 refills | Status: AC
  Filled 2023-08-09 – 2024-01-12 (×3): qty 30, 30d supply, fill #0

## 2023-08-09 MED ORDER — EPINEPHRINE 0.3 MG/0.3ML IJ SOAJ
0.3000 mg | INTRAMUSCULAR | 1 refills | Status: AC | PRN
  Filled 2023-08-09 – 2023-08-27 (×2): qty 2, 30d supply, fill #0

## 2023-08-17 ENCOUNTER — Other Ambulatory Visit (HOSPITAL_COMMUNITY): Payer: Self-pay

## 2023-08-24 DIAGNOSIS — J301 Allergic rhinitis due to pollen: Secondary | ICD-10-CM | POA: Diagnosis not present

## 2023-08-26 ENCOUNTER — Other Ambulatory Visit (HOSPITAL_COMMUNITY): Payer: Self-pay

## 2023-08-27 ENCOUNTER — Other Ambulatory Visit (HOSPITAL_COMMUNITY): Payer: Self-pay

## 2023-09-13 ENCOUNTER — Other Ambulatory Visit (HOSPITAL_COMMUNITY): Payer: Self-pay

## 2023-09-13 MED ORDER — PHENTERMINE HCL 37.5 MG PO TABS
37.5000 mg | ORAL_TABLET | Freq: Every day | ORAL | 0 refills | Status: DC
  Filled 2023-09-13: qty 30, 30d supply, fill #0

## 2023-09-20 DIAGNOSIS — J3089 Other allergic rhinitis: Secondary | ICD-10-CM | POA: Diagnosis not present

## 2023-09-20 DIAGNOSIS — J3081 Allergic rhinitis due to animal (cat) (dog) hair and dander: Secondary | ICD-10-CM | POA: Diagnosis not present

## 2023-09-20 DIAGNOSIS — J301 Allergic rhinitis due to pollen: Secondary | ICD-10-CM | POA: Diagnosis not present

## 2023-10-04 DIAGNOSIS — J301 Allergic rhinitis due to pollen: Secondary | ICD-10-CM | POA: Diagnosis not present

## 2023-10-04 DIAGNOSIS — J3081 Allergic rhinitis due to animal (cat) (dog) hair and dander: Secondary | ICD-10-CM | POA: Diagnosis not present

## 2023-10-04 DIAGNOSIS — J3089 Other allergic rhinitis: Secondary | ICD-10-CM | POA: Diagnosis not present

## 2023-10-19 ENCOUNTER — Other Ambulatory Visit (HOSPITAL_COMMUNITY): Payer: Self-pay

## 2023-10-19 ENCOUNTER — Other Ambulatory Visit: Payer: Self-pay

## 2023-10-19 MED ORDER — LEVOTHYROXINE SODIUM 25 MCG PO TABS
25.0000 ug | ORAL_TABLET | Freq: Every morning | ORAL | 0 refills | Status: DC
  Filled 2023-10-19: qty 30, 30d supply, fill #0

## 2023-10-20 ENCOUNTER — Other Ambulatory Visit: Payer: Self-pay

## 2023-10-20 ENCOUNTER — Other Ambulatory Visit (HOSPITAL_COMMUNITY): Payer: Self-pay

## 2023-10-20 MED ORDER — PHENTERMINE HCL 37.5 MG PO TABS
37.5000 mg | ORAL_TABLET | Freq: Every day | ORAL | 0 refills | Status: AC
Start: 2023-10-20 — End: ?
  Filled 2023-10-20: qty 30, 30d supply, fill #0

## 2023-10-25 ENCOUNTER — Other Ambulatory Visit (HOSPITAL_COMMUNITY): Payer: Self-pay

## 2023-10-29 ENCOUNTER — Other Ambulatory Visit (HOSPITAL_COMMUNITY): Payer: Self-pay

## 2023-11-15 DIAGNOSIS — J029 Acute pharyngitis, unspecified: Secondary | ICD-10-CM | POA: Diagnosis not present

## 2023-11-15 DIAGNOSIS — Z03818 Encounter for observation for suspected exposure to other biological agents ruled out: Secondary | ICD-10-CM | POA: Diagnosis not present

## 2023-11-17 ENCOUNTER — Other Ambulatory Visit (HOSPITAL_COMMUNITY): Payer: Self-pay

## 2023-11-17 ENCOUNTER — Other Ambulatory Visit: Payer: Self-pay

## 2023-11-17 MED ORDER — LEVOTHYROXINE SODIUM 25 MCG PO TABS
25.0000 ug | ORAL_TABLET | Freq: Every morning | ORAL | 2 refills | Status: AC
  Filled 2023-11-17 – 2024-01-12 (×2): qty 90, 90d supply, fill #0

## 2023-11-29 ENCOUNTER — Other Ambulatory Visit (HOSPITAL_COMMUNITY): Payer: Self-pay

## 2023-12-07 ENCOUNTER — Other Ambulatory Visit (HOSPITAL_COMMUNITY): Payer: Self-pay

## 2023-12-09 ENCOUNTER — Other Ambulatory Visit (HOSPITAL_COMMUNITY): Payer: Self-pay

## 2023-12-09 DIAGNOSIS — T148XXA Other injury of unspecified body region, initial encounter: Secondary | ICD-10-CM | POA: Diagnosis not present

## 2023-12-09 DIAGNOSIS — J309 Allergic rhinitis, unspecified: Secondary | ICD-10-CM | POA: Diagnosis not present

## 2023-12-09 DIAGNOSIS — E66811 Obesity, class 1: Secondary | ICD-10-CM | POA: Diagnosis not present

## 2023-12-09 DIAGNOSIS — L304 Erythema intertrigo: Secondary | ICD-10-CM | POA: Diagnosis not present

## 2023-12-09 MED ORDER — KETOCONAZOLE 2 % EX CREA
1.0000 | TOPICAL_CREAM | Freq: Every day | CUTANEOUS | 2 refills | Status: AC
  Filled 2023-12-09: qty 15, 14d supply, fill #0

## 2023-12-22 ENCOUNTER — Other Ambulatory Visit (HOSPITAL_COMMUNITY): Payer: Self-pay

## 2023-12-24 ENCOUNTER — Other Ambulatory Visit (HOSPITAL_COMMUNITY): Payer: Self-pay

## 2024-01-12 ENCOUNTER — Other Ambulatory Visit (HOSPITAL_COMMUNITY): Payer: Self-pay

## 2024-01-12 ENCOUNTER — Other Ambulatory Visit: Payer: Self-pay

## 2024-01-13 ENCOUNTER — Other Ambulatory Visit (HOSPITAL_COMMUNITY): Payer: Self-pay

## 2024-01-26 ENCOUNTER — Other Ambulatory Visit (HOSPITAL_COMMUNITY): Payer: Self-pay

## 2024-02-11 ENCOUNTER — Other Ambulatory Visit (HOSPITAL_COMMUNITY): Payer: Self-pay

## 2024-02-11 MED ORDER — MONTELUKAST SODIUM 10 MG PO TABS
10.0000 mg | ORAL_TABLET | Freq: Every day | ORAL | 1 refills | Status: DC
  Filled 2024-02-11 – 2024-03-07 (×2): qty 90, 90d supply, fill #0
  Filled 2024-04-06 – 2024-08-08 (×3): qty 90, 90d supply, fill #1

## 2024-02-24 DIAGNOSIS — D485 Neoplasm of uncertain behavior of skin: Secondary | ICD-10-CM | POA: Diagnosis not present

## 2024-02-24 DIAGNOSIS — D225 Melanocytic nevi of trunk: Secondary | ICD-10-CM | POA: Diagnosis not present

## 2024-03-07 ENCOUNTER — Other Ambulatory Visit: Payer: Self-pay

## 2024-03-07 ENCOUNTER — Other Ambulatory Visit (HOSPITAL_COMMUNITY): Payer: Self-pay

## 2024-03-07 DIAGNOSIS — E66811 Obesity, class 1: Secondary | ICD-10-CM | POA: Diagnosis not present

## 2024-03-07 MED ORDER — PHENTERMINE HCL 37.5 MG PO TABS
37.5000 mg | ORAL_TABLET | Freq: Every day | ORAL | 0 refills | Status: DC
  Filled 2024-03-07: qty 30, 30d supply, fill #0

## 2024-04-06 ENCOUNTER — Other Ambulatory Visit (HOSPITAL_COMMUNITY): Payer: Self-pay

## 2024-04-07 ENCOUNTER — Other Ambulatory Visit (HOSPITAL_COMMUNITY): Payer: Self-pay

## 2024-04-07 MED ORDER — PHENTERMINE HCL 37.5 MG PO TABS
37.5000 mg | ORAL_TABLET | Freq: Every day | ORAL | 0 refills | Status: DC
  Filled 2024-04-07: qty 30, 30d supply, fill #0

## 2024-04-07 MED ORDER — TARINA 24 FE 1-20 MG-MCG(24) PO TABS
1.0000 | ORAL_TABLET | Freq: Every day | ORAL | 0 refills | Status: DC
Start: 2024-04-07 — End: 2024-07-03
  Filled 2024-04-07: qty 84, 84d supply, fill #0

## 2024-04-24 ENCOUNTER — Other Ambulatory Visit (HOSPITAL_COMMUNITY): Payer: Self-pay

## 2024-04-24 DIAGNOSIS — H66001 Acute suppurative otitis media without spontaneous rupture of ear drum, right ear: Secondary | ICD-10-CM | POA: Diagnosis not present

## 2024-04-24 DIAGNOSIS — J040 Acute laryngitis: Secondary | ICD-10-CM | POA: Diagnosis not present

## 2024-04-24 DIAGNOSIS — J069 Acute upper respiratory infection, unspecified: Secondary | ICD-10-CM | POA: Diagnosis not present

## 2024-04-24 MED ORDER — AMOXICILLIN 500 MG PO CAPS
500.0000 mg | ORAL_CAPSULE | Freq: Three times a day (TID) | ORAL | 0 refills | Status: AC
  Filled 2024-04-24: qty 15, 5d supply, fill #0

## 2024-05-15 ENCOUNTER — Other Ambulatory Visit (HOSPITAL_COMMUNITY): Payer: Self-pay

## 2024-05-15 MED ORDER — PHENTERMINE HCL 37.5 MG PO TABS
37.5000 mg | ORAL_TABLET | Freq: Every day | ORAL | 0 refills | Status: DC
  Filled 2024-05-15: qty 30, 30d supply, fill #0

## 2024-06-22 ENCOUNTER — Other Ambulatory Visit (HOSPITAL_COMMUNITY): Payer: Self-pay

## 2024-06-22 ENCOUNTER — Other Ambulatory Visit: Payer: Self-pay

## 2024-06-22 MED ORDER — PHENTERMINE HCL 37.5 MG PO TABS
37.5000 mg | ORAL_TABLET | Freq: Every day | ORAL | 0 refills | Status: AC
  Filled 2024-06-22: qty 30, 30d supply, fill #0

## 2024-06-30 ENCOUNTER — Other Ambulatory Visit (HOSPITAL_COMMUNITY): Payer: Self-pay

## 2024-07-03 ENCOUNTER — Other Ambulatory Visit (HOSPITAL_COMMUNITY): Payer: Self-pay

## 2024-07-03 MED ORDER — TARINA 24 FE 1-20 MG-MCG(24) PO TABS
1.0000 | ORAL_TABLET | Freq: Every day | ORAL | 0 refills | Status: DC
  Filled 2024-07-03: qty 84, 84d supply, fill #0

## 2024-08-01 DIAGNOSIS — Z3041 Encounter for surveillance of contraceptive pills: Secondary | ICD-10-CM | POA: Diagnosis not present

## 2024-08-01 DIAGNOSIS — Z1389 Encounter for screening for other disorder: Secondary | ICD-10-CM | POA: Diagnosis not present

## 2024-08-01 DIAGNOSIS — N942 Vaginismus: Secondary | ICD-10-CM | POA: Diagnosis not present

## 2024-08-01 DIAGNOSIS — Z130 Encounter for screening for diseases of the blood and blood-forming organs and certain disorders involving the immune mechanism: Secondary | ICD-10-CM | POA: Diagnosis not present

## 2024-08-01 DIAGNOSIS — Z01419 Encounter for gynecological examination (general) (routine) without abnormal findings: Secondary | ICD-10-CM | POA: Diagnosis not present

## 2024-08-02 ENCOUNTER — Other Ambulatory Visit (HOSPITAL_COMMUNITY): Payer: Self-pay

## 2024-08-02 MED ORDER — TARINA 24 FE 1-20 MG-MCG(24) PO TABS
1.0000 | ORAL_TABLET | Freq: Every day | ORAL | 3 refills | Status: AC
  Filled 2024-08-02 – 2024-09-24 (×3): qty 84, 84d supply, fill #0
  Filled 2024-12-18: qty 84, 84d supply, fill #1

## 2024-08-05 ENCOUNTER — Other Ambulatory Visit (HOSPITAL_COMMUNITY): Payer: Self-pay

## 2024-08-07 ENCOUNTER — Other Ambulatory Visit (HOSPITAL_COMMUNITY): Payer: Self-pay

## 2024-08-07 DIAGNOSIS — E66811 Obesity, class 1: Secondary | ICD-10-CM | POA: Diagnosis not present

## 2024-08-07 DIAGNOSIS — J309 Allergic rhinitis, unspecified: Secondary | ICD-10-CM | POA: Diagnosis not present

## 2024-08-07 DIAGNOSIS — Z0000 Encounter for general adult medical examination without abnormal findings: Secondary | ICD-10-CM | POA: Diagnosis not present

## 2024-08-07 DIAGNOSIS — E039 Hypothyroidism, unspecified: Secondary | ICD-10-CM | POA: Diagnosis not present

## 2024-08-07 DIAGNOSIS — E559 Vitamin D deficiency, unspecified: Secondary | ICD-10-CM | POA: Diagnosis not present

## 2024-08-07 MED ORDER — PHENTERMINE HCL 15 MG PO CAPS
15.0000 mg | ORAL_CAPSULE | Freq: Every day | ORAL | 2 refills | Status: DC
  Filled 2024-08-07: qty 30, 30d supply, fill #0
  Filled 2024-09-06: qty 30, 30d supply, fill #1
  Filled 2024-10-16: qty 30, 30d supply, fill #2

## 2024-08-08 ENCOUNTER — Other Ambulatory Visit (HOSPITAL_COMMUNITY): Payer: Self-pay

## 2024-09-07 ENCOUNTER — Other Ambulatory Visit: Payer: Self-pay

## 2024-09-25 ENCOUNTER — Other Ambulatory Visit (HOSPITAL_COMMUNITY): Payer: Self-pay

## 2024-10-16 ENCOUNTER — Other Ambulatory Visit: Payer: Self-pay

## 2024-11-07 ENCOUNTER — Other Ambulatory Visit (HOSPITAL_COMMUNITY): Payer: Self-pay

## 2024-11-07 MED ORDER — PHENTERMINE HCL 15 MG PO CAPS
15.0000 mg | ORAL_CAPSULE | Freq: Every day | ORAL | 0 refills | Status: AC
  Filled 2024-11-07 – 2024-12-05 (×3): qty 42, 42d supply, fill #0

## 2024-11-08 ENCOUNTER — Other Ambulatory Visit (HOSPITAL_COMMUNITY): Payer: Self-pay

## 2024-11-18 ENCOUNTER — Other Ambulatory Visit (HOSPITAL_COMMUNITY): Payer: Self-pay

## 2024-11-20 ENCOUNTER — Other Ambulatory Visit: Payer: Self-pay

## 2024-11-20 ENCOUNTER — Other Ambulatory Visit (HOSPITAL_COMMUNITY): Payer: Self-pay

## 2024-11-20 MED ORDER — MONTELUKAST SODIUM 10 MG PO TABS
10.0000 mg | ORAL_TABLET | Freq: Every day | ORAL | 1 refills | Status: AC
  Filled 2024-11-20 – 2024-12-05 (×2): qty 90, 90d supply, fill #0

## 2024-11-28 ENCOUNTER — Other Ambulatory Visit (HOSPITAL_COMMUNITY): Payer: Self-pay

## 2024-11-30 ENCOUNTER — Other Ambulatory Visit (HOSPITAL_COMMUNITY): Payer: Self-pay

## 2024-12-05 ENCOUNTER — Other Ambulatory Visit (HOSPITAL_COMMUNITY): Payer: Self-pay

## 2025-01-18 ENCOUNTER — Encounter: Payer: Self-pay | Admitting: Physical Therapy

## 2025-01-18 ENCOUNTER — Ambulatory Visit: Admitting: Physical Therapy

## 2025-01-18 ENCOUNTER — Other Ambulatory Visit: Payer: Self-pay

## 2025-01-18 DIAGNOSIS — M6281 Muscle weakness (generalized): Secondary | ICD-10-CM

## 2025-01-18 DIAGNOSIS — M62838 Other muscle spasm: Secondary | ICD-10-CM

## 2025-01-18 DIAGNOSIS — R293 Abnormal posture: Secondary | ICD-10-CM

## 2025-01-18 DIAGNOSIS — R279 Unspecified lack of coordination: Secondary | ICD-10-CM

## 2025-01-18 NOTE — Therapy (Signed)
 " OUTPATIENT PHYSICAL THERAPY FEMALE PELVIC EVALUATION   Patient Name: Jaclyn Reynolds MRN: [id number] DOB:20-Apr-1996, 29 y.o., female Today's Date: 01/18/2025  END OF SESSION:  PT End of Session - 01/18/25 0933     Visit Number 1    Date for Recertification  07/18/25    Authorization Type cone    PT Start Time 0932    PT Stop Time 1015    PT Time Calculation (min) 43 min    Activity Tolerance Patient tolerated treatment well    Behavior During Therapy [facility] for tasks assessed/performed          Past Medical History:  Diagnosis Date   Hypothyroidism    Past Surgical History:  Procedure Laterality Date   BREAST REDUCTION SURGERY  2016   CESAREAN SECTION N/A 12/11/2022   Procedure: CESAREAN SECTION;  Surgeon: Diedre Rosaline BRAVO, MD;  Location: [facility] LD ORS;  Service: Obstetrics;  Laterality: N/A;   FRACTURE SURGERY Right 09-02-12   current   HARDWARE REMOVAL Right 04/21/2013   Procedure: HARDWARE REMOVAL OF RIGHT KNEE;  Surgeon: Dempsey LULLA Moan, MD;  Location: [facility] ORS;  Service: Orthopedics;  Laterality: Right;   no previous surgery     had caps put on teeth age 77 in dentist office -probable sedation?   ORIF PATELLA  09/02/2012   Procedure: OPEN REDUCTION INTERNAL (ORIF) FIXATION PATELLA;  Surgeon: Dempsey LULLA Moan, MD;  Location: [facility] ORS;  Service: Orthopedics;  Laterality: Right;   Patient Active Problem List   Diagnosis Date Noted   Polyhydramnios affecting pregnancy in third trimester 12/10/2022   Polyp of nasal cavity 07/24/2020   Perennial allergic rhinitis 07/24/2020   Painful orthopaedic hardware 04/21/2013   Right patella fracture 09/02/2012    PCP: Delice Charleston, MD   REFERRING PROVIDER: Key, Hargis HERO, NP  REFERRING DIAG: [phone] (ICD-10-CM) - Vaginismus  THERAPY DIAG:  Other muscle spasm - Plan: PT plan of care cert/re-cert  Unspecified lack of coordination - Plan: PT plan of care cert/re-cert  Muscle weakness (generalized) - Plan: PT plan of care  cert/re-cert  Abnormal posture - Plan: PT plan of care cert/re-cert  Rationale for Evaluation and Treatment: Rehabilitation  ONSET DATE: since first sexual encounter   SUBJECTIVE:                                                                                                                                                                                           SUBJECTIVE STATEMENT: Pain with intercourse with penetration and throughout intercourse. Reports slight improvement since having baby. Has dilators she uses. Sometimes has burning with penetration and intercourse.    FUNCTIONAL  LIMITATIONS: intercourse   PERTINENT HISTORY:  Medications for current condition: birth control (pill) Surgeries: c-section (29 years old) Other: no Sexual abuse: No  PAIN:  Are you having pain? Yes NPRS scale: 5/10 Pain location: Vaginal  Pain type: burning and sharp Pain description: sharp and stabbing   Aggravating factors: intercourse Relieving factors: stopping sex  PRECAUTIONS: None  RED FLAGS: None   WEIGHT BEARING RESTRICTIONS: No  FALLS:  Has patient fallen in last 6 months? No  OCCUPATION: nurse technique  ACTIVITY LEVEL : moderate   PLOF: Independent  PATIENT GOALS: to have no pain with sex    BOWEL MOVEMENT: No concerns  URINATION: No concerns   INTERCOURSE:  Ability to have vaginal penetration Yes  Pain with intercourse: Initial Penetration, During Penetration, Deep Penetration, and Pain Interrupts Intercourse Dryness: Yes  Climax: able but more difficult  Marinoff Scale: 2/3 Lubricant:yes  PREGNANCY: Vaginal deliveries 0 Tearing No Episiotomy No C-section deliveries 1 Currently pregnant No  PROLAPSE: None   OBJECTIVE:  Note: Objective measures were completed at Evaluation unless otherwise noted.   COGNITION: Overall cognitive status: Within functional limits for tasks assessed     SENSATION: Light touch: Appears intact  FUNCTIONAL  TESTS:   Single leg stance: unstable bil Sit-up test:1/3 Squat: bil knee valgus   GAIT: WFL  POSTURE: rounded shoulders and forward head   LUMBARAROM/PROM:  A/PROM A/PROM  Eval (% available)  Flexion 75  Extension 100  Right lateral flexion 75  Left lateral flexion 75  Right rotation 100  Left rotation 100   (Blank rows = not tested)  LOWER EXTREMITY ROM:  Bil hamstrings and adductors and hip rotators limited by 25%  LOWER EXTREMITY MMT: Bil hips grossly 4/5 PALPATION:  General: tightness in bil glutes and lumbar paraspinals    Abdominal: tightness in lower abdominal quadrants   Diastasis: to be assessed  Distortion: No  Breathing: chest                External Perineal Exam: dryness noted in vaginal opening                              Internal Pelvic Floor: TTP at perineal body, superficial layers bil with pain and tension, also present in deep layer but not as high as superficial   Patient confirms identification and approves PT to assess internal pelvic floor and treatment Yes No emotional/communication barriers or cognitive limitation. Patient is motivated to learn. Patient understands and agrees with treatment goals and plan. PT explains patient will be examined in standing, sitting, and lying down to see how their muscles and joints work. When they are ready, they will be asked to remove their underwear so PT can examine their perineum. The patient is also given the option of providing their own chaperone as one is not provided in our facility. The patient also has the right and is explained the right to defer or refuse any part of the evaluation or treatment including the internal exam. With the patient's consent, PT will use one gloved finger to gently assess the muscles of the pelvic floor, seeing how well it contracts and relaxes and if there is muscle symmetry. After, the patient will get dressed and PT and patient will discuss exam findings and plan of care.  PT and patient discuss plan of care, schedule, attendance policy and HEP activities.  All internal or external pelvic floor assessments and/or treatments are  completed with proper hand hygiene and gloves hands. If needed gloves are changed with hand hygiene during patient care time.  PELVIC MMT:   MMT eval  Vaginal 4/5, 6s, 4 reps  Internal Anal Sphincter   External Anal Sphincter   Puborectalis   (Blank rows = not tested)        TONE: Increased   PROLAPSE: Not seen in hooklying   TODAY'S TREATMENT:                                                                                                                              DATE:   01/18/25 EVAL Examination completed, findings reviewed, pt educated on POC, education on her dilators which pt already has, relaxation techniques,  and perineal moisturizers and lubricant use. Pt motivated to participate in PT and agreeable to attempt recommendations.     PATIENT EDUCATION:  Education details: dilator use, lubricants, moisturizer  Person educated: Patient Education method: Explanation, Demonstration, Tactile cues, Verbal cues, and Handouts Education comprehension: verbalized understanding, returned demonstration, verbal cues required, tactile cues required, and needs further education  HOME EXERCISE PROGRAM: To be given   ASSESSMENT:  CLINICAL IMPRESSION: Patient is a 29 y.o. female  who was seen today for physical therapy evaluation and treatment for pain with intercourse. Pt reports she has had pain with intercourse since becoming sexually active. Pt did have pelvic PT prior but hasn't been able to return to her previous clinic and wants to continue to work on her pain. Pt has vaginal dilators and isn't using them consistently and unsure about frequency and use recommendations. Pt benefited from emotional support throughout the evaluation as pt upset about her chronic pain and concerns about intimacy. Extra time spent on educating pt on  relaxation techniques, vaginal dilator use and skin integrity options. Patient consented to internal pelvic floor assessment vaginally this date and found to have increased tension throughout pelvic floor with pain with palpation superficially. Did improve with cues for diaphragmatic breathing and pelvic floor muscle bulging to decreased tension. Pt would benefit from additional PT to further address deficits.    OBJECTIVE IMPAIRMENTS: decreased activity tolerance, decreased coordination, decreased endurance, decreased mobility, decreased strength, increased fascial restrictions, impaired perceived functional ability, increased muscle spasms, impaired flexibility, impaired sensation, improper body mechanics, postural dysfunction, and pain.   ACTIVITY LIMITATIONS: intercourse  PARTICIPATION LIMITATIONS: interpersonal relationship  PERSONAL FACTORS: Time since onset of injury/illness/exacerbation are also affecting patient's functional outcome.   REHAB POTENTIAL: Good  CLINICAL DECISION MAKING: Stable/uncomplicated  EVALUATION COMPLEXITY: Low   GOALS: Goals reviewed with patient? Yes  SHORT TERM GOALS: Target date: 02/15/25  Pt to be I with HEP for carry over and continuing recommendations for improved outcomes.   Baseline: Goal status: INITIAL  2.  Pt to be I with relaxation techniques to better tolerate dilator use for home to decreased pain with penetration . Baseline:  Goal status: INITIAL  3.  Pt to be I  with use of lubricant and or moisturizer for improved tissue mobility and decreased burning/tearing sensation with penetration.  Baseline:  Goal status: INITIAL   LONG TERM GOALS: Target date: 07/18/25  Pt to be I with advanced HEP for carry over and continuing recommendations for improved outcomes.   Baseline:  Goal status: INITIAL  2.  Pt to report no more than 2/10 pain with vaginal penetration with dilator size 6 or equivalent to improve tolerance to medical exams and  intercourse.  Baseline:  Goal status: INITIAL  3.  Pt to demonstrate no restrictions in bil hip mobility to decreased tension at pelvic floor.  Baseline:  Goal status: INITIAL   PLAN:  PT FREQUENCY: 1x/week  PT DURATION: 8 sessions  PLANNED INTERVENTIONS: 97110-Therapeutic exercises, 97530- Therapeutic activity, 97112- Neuromuscular re-education, 97535- Self Care, [id number]- Manual therapy, [phone]- Canalith repositioning, [id number]- Aquatic Therapy, [phone]- Electrical stimulation (manual), [id number]- Vasopneumatic device, [phone] (1-2 muscles), [id number] (3+ muscles)- Dry Needling, Patient/Family education, Taping, Joint mobilization, Spinal mobilization, Scar mobilization, DME instructions, Cryotherapy, Moist heat, and Biofeedback  PLAN FOR NEXT SESSION: stretching hips and low back, breathing mechanics, pelvic floor down training   Darryle Navy, PT, DPT 01/18/2610:43 AM  [facility] [address], [address] [place], [place] [address] Phone # [phone] Fax [phone]  "

## 2025-01-18 NOTE — Patient Instructions (Signed)
Lubrication Used for intercourse to reduce friction Avoid ones that have glycerin, nonoxynol-9, petroleum, propylene glycol, chlorhexidine gluconate, warming gels, tingling gels, icing or cooling gel, scented Avoid parabens due to a preservative similar to female sex hormone May need to be reapplied once or several times during sexual activity Can be applied to both partners genitals prior to vaginal penetration to minimize friction or irritation Prevent irritation and mucosal tears that cause post coital pain and increased the risk of vaginal and urinary tract infections Oil-based lubricants cannot be used with condoms due to breaking them down.  Least likely to irritate vaginal tissue.  Plant based-lubes are safe Silicone-based lubrication are thicker and last long and used for post-menopausal women  Vaginal Lubricators Here is a list of some suggested lubricators you can use for intercourse. Use the most hypoallergenic product.  You can place on you or your partner.  Slippery Stuff ( water based) Sylk or Sliquid Natural H2O ( good  if frequent UTI's)- [employer], amazon Sliquid organics silk-(aloe and silicone based ) [employer] (www.blossom-organics.com)- (aloe based ) Coconut oil, olive oil -not good with condoms  PJur Woman Nude- (water based) amazon Uberlube- ( silicon) Amazon Aloe Vera- Sprouts has an organic one Yes lubricant- (water based and has plant oil based similar to silicone) [employer] Platinum-Silicone, [employer], [facility] Olive and Bee intimate cream-  www.oliveandbee.com.au Pink - [employer] Erosense Sync- [facility], amazon Coconu- coconu.com [place] [employer] Good Clean Love lubricants  Things to avoid in lubricants are glycerin, warming gels, tingling gels, icing or cooling  gels, and scented gels.  Also avoid Vaseline. KY jelly,  and Astroglide contain chlorhexidine which kills good bacteria(lactobacilli)  Things to avoid in the vaginal area Do not use  things to irritate the vulvar area No lotions- see below Soaps you  can use :Aveeno, Calendula, Good Clean Love cleanser if needed. Must be gentle No deodorants No douches Good to sleep without underwear to let the vaginal area to air out No scrubbing: spread the lips to let warm water rinse over labias and pat dry  Creams that can be used on the Vulva Area V [employer], [employer] Vital V [place] Salve Julva- [employer] Botanical Pro-Meno [place] Cream Coconut oil, olive oil Cleo by [employer] labial moisturizer -Amazon,  [place] Releveum ( lidocaine) or Desert [employer] Yes Moisturizer      Moisturizers They are used in the vagina to hydrate the mucous membrane that make up the vaginal canal. Designed to keep a more normal acid balance (ph) Once placed in the vagina, it will last between two to three days.  Use 2-3 times per week at bedtime  Ingredients to avoid is glycerin and fragrance, can increase chance of infection Should not be used just before sex due to causing irritation Most are gels administered either in a tampon-shaped applicator or as a vaginal suppository. They are non-hormonal.   Types of Moisturizers(internal use)  Vitamin E vaginal suppositories- Whole foods, Amazon Moist Again Coconut oil- can break down condoms, any grocery store (prefer organic) Julva- (Do no use if taking  Tamoxifen) amazon Yes moisturizer- amazon NeuEve Silk , NeuEve Silver for menopausal or over 65 (if have severe vaginal atrophy or cancer treatments use NeuEve Silk for  1 month than move to [employer])- [employer], [email or web address].com Olive and Bee intimate cream- www.oliveandbee.com.au Mae vaginal moisturizer- Amazon Aloe Good Clean Love Hyaluronic acid Hyalofemme Reveree hyaluronic acid inserts   Creams to use externally on the Vulva area  Desert [employer] (good for for cancer patients that had radiation to the area)- [place] or [email or web address] Vulva Balm/  V-magic cream by medicine mama- amazon Julva-amazon Vital "V Wild Yam salve ( help moisturize and help with thinning vulvar area, does have Beeswax MoodMaid Botanical Pro-Meno [place] Cream- Amazon [place] Cleo by Zane Herald labial moisturizer (Amazon),  Coconut or olive oil aloe Good Clean Love Enchanted Rose by intimate rose  Things to avoid in the vaginal area Do not use things to irritate the vulvar area No lotions just specialized creams for the vulva area- Neogyn, V-magic,  No soaps; can use Aveeno or Calendula cleanser, unscented Dove if needed. Must be gentle No deodorants No douches Good to sleep without underwear to let the vaginal area to air out No scrubbing: spread the lips to let warm water rinse over labias and pat dry

## 2025-02-15 ENCOUNTER — Ambulatory Visit: Payer: Self-pay | Admitting: Physical Therapy

## 2025-02-21 ENCOUNTER — Ambulatory Visit: Admitting: Physical Therapy

## 2025-02-27 ENCOUNTER — Ambulatory Visit: Admitting: Physical Therapy

## 2025-03-07 ENCOUNTER — Ambulatory Visit: Admitting: Physical Therapy
